# Patient Record
Sex: Female | Born: 1945 | Race: White | Hispanic: No | Smoking: Never smoker
Health system: Southern US, Community
[De-identification: ages and names within clinical notes are randomized; demographics above are authoritative.]

## PROBLEM LIST (undated history)

## (undated) DIAGNOSIS — F419 Anxiety disorder, unspecified: Secondary | ICD-10-CM

## (undated) DIAGNOSIS — M1990 Unspecified osteoarthritis, unspecified site: Secondary | ICD-10-CM

## (undated) DIAGNOSIS — F32A Depression, unspecified: Secondary | ICD-10-CM

## (undated) DIAGNOSIS — Z972 Presence of dental prosthetic device (complete) (partial): Secondary | ICD-10-CM

## (undated) DIAGNOSIS — W5501XA Bitten by cat, initial encounter: Secondary | ICD-10-CM

## (undated) DIAGNOSIS — I10 Essential (primary) hypertension: Secondary | ICD-10-CM

## (undated) DIAGNOSIS — J302 Other seasonal allergic rhinitis: Secondary | ICD-10-CM

## (undated) DIAGNOSIS — F329 Major depressive disorder, single episode, unspecified: Secondary | ICD-10-CM

## (undated) DIAGNOSIS — E7800 Pure hypercholesterolemia, unspecified: Secondary | ICD-10-CM

## (undated) HISTORY — PX: JOINT REPLACEMENT: SHX530

---

## 2012-06-27 ENCOUNTER — Ambulatory Visit: Payer: Self-pay | Admitting: Unknown Physician Specialty

## 2012-06-29 LAB — PATHOLOGY REPORT

## 2013-06-06 ENCOUNTER — Ambulatory Visit: Payer: Self-pay | Admitting: Family Medicine

## 2014-09-18 ENCOUNTER — Other Ambulatory Visit: Payer: Self-pay | Admitting: Family Medicine

## 2014-09-18 DIAGNOSIS — R1084 Generalized abdominal pain: Secondary | ICD-10-CM

## 2014-09-18 DIAGNOSIS — R319 Hematuria, unspecified: Secondary | ICD-10-CM

## 2014-09-23 ENCOUNTER — Ambulatory Visit
Admission: RE | Admit: 2014-09-23 | Discharge: 2014-09-23 | Disposition: A | Discharge status: Home / Self Care | Payer: Medicare Other | Source: Ambulatory Visit | Attending: Family Medicine | Admitting: Family Medicine

## 2014-09-23 DIAGNOSIS — R319 Hematuria, unspecified: Secondary | ICD-10-CM | POA: Insufficient documentation

## 2014-09-23 DIAGNOSIS — N281 Cyst of kidney, acquired: Secondary | ICD-10-CM | POA: Insufficient documentation

## 2014-09-23 DIAGNOSIS — K5730 Diverticulosis of large intestine without perforation or abscess without bleeding: Secondary | ICD-10-CM | POA: Diagnosis not present

## 2014-09-23 DIAGNOSIS — K760 Fatty (change of) liver, not elsewhere classified: Secondary | ICD-10-CM | POA: Insufficient documentation

## 2014-09-23 DIAGNOSIS — R1084 Generalized abdominal pain: Secondary | ICD-10-CM

## 2014-09-23 HISTORY — DX: Essential (primary) hypertension: I10

## 2014-09-23 MED ORDER — IOHEXOL 300 MG/ML  SOLN
100.0000 mL | Freq: Once | INTRAMUSCULAR | Status: AC | PRN
  Administered 2014-09-23: 100 mL via INTRAVENOUS

## 2015-02-18 ENCOUNTER — Encounter: Payer: Self-pay | Admitting: *Deleted

## 2015-02-20 NOTE — Discharge Instructions (Signed)

## 2015-02-25 ENCOUNTER — Encounter
Admission: RE | Disposition: A | Discharge status: Home / Self Care | Payer: Self-pay | Source: Ambulatory Visit | Attending: Ophthalmology

## 2015-02-25 ENCOUNTER — Ambulatory Visit
Admission: RE | Admit: 2015-02-25 | Discharge: 2015-02-25 | Disposition: A | Discharge status: Home / Self Care | Payer: Medicare Other | Source: Ambulatory Visit | Attending: Ophthalmology | Admitting: Ophthalmology

## 2015-02-25 ENCOUNTER — Ambulatory Visit: Payer: Medicare Other | Admitting: Anesthesiology

## 2015-02-25 DIAGNOSIS — F419 Anxiety disorder, unspecified: Secondary | ICD-10-CM | POA: Insufficient documentation

## 2015-02-25 DIAGNOSIS — M1991 Primary osteoarthritis, unspecified site: Secondary | ICD-10-CM | POA: Diagnosis not present

## 2015-02-25 DIAGNOSIS — Z9109 Other allergy status, other than to drugs and biological substances: Secondary | ICD-10-CM | POA: Insufficient documentation

## 2015-02-25 DIAGNOSIS — Z9889 Other specified postprocedural states: Secondary | ICD-10-CM | POA: Insufficient documentation

## 2015-02-25 DIAGNOSIS — E7800 Pure hypercholesterolemia, unspecified: Secondary | ICD-10-CM | POA: Diagnosis not present

## 2015-02-25 DIAGNOSIS — H269 Unspecified cataract: Secondary | ICD-10-CM | POA: Diagnosis present

## 2015-02-25 DIAGNOSIS — Z79899 Other long term (current) drug therapy: Secondary | ICD-10-CM | POA: Insufficient documentation

## 2015-02-25 DIAGNOSIS — Z888 Allergy status to other drugs, medicaments and biological substances status: Secondary | ICD-10-CM | POA: Diagnosis not present

## 2015-02-25 DIAGNOSIS — F329 Major depressive disorder, single episode, unspecified: Secondary | ICD-10-CM | POA: Diagnosis not present

## 2015-02-25 DIAGNOSIS — H2511 Age-related nuclear cataract, right eye: Secondary | ICD-10-CM | POA: Insufficient documentation

## 2015-02-25 DIAGNOSIS — Z7951 Long term (current) use of inhaled steroids: Secondary | ICD-10-CM | POA: Insufficient documentation

## 2015-02-25 HISTORY — DX: Bitten by cat, initial encounter: W55.01XA

## 2015-02-25 HISTORY — DX: Other seasonal allergic rhinitis: J30.2

## 2015-02-25 HISTORY — DX: Presence of dental prosthetic device (complete) (partial): Z97.2

## 2015-02-25 HISTORY — PX: CATARACT EXTRACTION W/PHACO: SHX586

## 2015-02-25 HISTORY — DX: Depression, unspecified: F32.A

## 2015-02-25 HISTORY — DX: Unspecified osteoarthritis, unspecified site: M19.90

## 2015-02-25 HISTORY — DX: Major depressive disorder, single episode, unspecified: F32.9

## 2015-02-25 HISTORY — DX: Pure hypercholesterolemia, unspecified: E78.00

## 2015-02-25 HISTORY — DX: Anxiety disorder, unspecified: F41.9

## 2015-02-25 SURGERY — PHACOEMULSIFICATION, CATARACT, WITH IOL INSERTION
Anesthesia: Monitor Anesthesia Care | Site: Eye | Laterality: Right | Wound class: Clean

## 2015-02-25 MED ORDER — POVIDONE-IODINE 5 % OP SOLN
1.0000 "application " | OPHTHALMIC | Status: DC | PRN
  Administered 2015-02-25: 1 via OPHTHALMIC

## 2015-02-25 MED ORDER — FENTANYL CITRATE (PF) 100 MCG/2ML IJ SOLN
INTRAMUSCULAR | Status: DC | PRN
  Administered 2015-02-25 (×2): 50 ug via INTRAVENOUS

## 2015-02-25 MED ORDER — NA HYALUR & NA CHOND-NA HYALUR 0.4-0.35 ML IO KIT
PACK | INTRAOCULAR | Status: DC | PRN
  Administered 2015-02-25: 1 mL via INTRAOCULAR

## 2015-02-25 MED ORDER — TIMOLOL MALEATE 0.5 % OP SOLN
OPHTHALMIC | Status: DC | PRN
  Administered 2015-02-25: 1 [drp] via OPHTHALMIC

## 2015-02-25 MED ORDER — CEFUROXIME OPHTHALMIC INJECTION 1 MG/0.1 ML
INJECTION | OPHTHALMIC | Status: DC | PRN
  Administered 2015-02-25: 0.1 mL via INTRACAMERAL

## 2015-02-25 MED ORDER — BRIMONIDINE TARTRATE 0.2 % OP SOLN
OPHTHALMIC | Status: DC | PRN
Start: 2015-02-25 — End: 2015-02-25
  Administered 2015-02-25: 1 [drp] via OPHTHALMIC

## 2015-02-25 MED ORDER — TETRACAINE HCL 0.5 % OP SOLN
1.0000 [drp] | OPHTHALMIC | Status: DC | PRN
  Administered 2015-02-25: 1 [drp] via OPHTHALMIC

## 2015-02-25 MED ORDER — LACTATED RINGERS IV SOLN: INTRAVENOUS | Status: DC

## 2015-02-25 MED ORDER — ARMC OPHTHALMIC DILATING GEL
1.0000 "application " | OPHTHALMIC | Status: DC | PRN
  Administered 2015-02-25 (×2): 1 via OPHTHALMIC

## 2015-02-25 MED ORDER — EPINEPHRINE HCL 1 MG/ML IJ SOLN
INTRAOCULAR | Status: DC | PRN
  Administered 2015-02-25: 65 mL via OPHTHALMIC
  Administered 2015-02-25: 12:00:00 via OPHTHALMIC

## 2015-02-25 MED ORDER — MIDAZOLAM HCL 2 MG/2ML IJ SOLN
INTRAMUSCULAR | Status: DC | PRN
  Administered 2015-02-25 (×2): 1 mg via INTRAVENOUS

## 2015-02-25 SURGICAL SUPPLY — 27 items
CANNULA ANT/CHMB 27GA (MISCELLANEOUS) ×3 IMPLANT
CARTRIDGE ABBOTT (MISCELLANEOUS) ×3 IMPLANT
GLOVE SURG LX 7.5 STRW (GLOVE) ×2
GLOVE SURG LX STRL 7.5 STRW (GLOVE) ×1 IMPLANT
GLOVE SURG TRIUMPH 8.0 PF LTX (GLOVE) ×3 IMPLANT
GOWN STRL REUS W/ TWL LRG LVL3 (GOWN DISPOSABLE) ×2 IMPLANT
GOWN STRL REUS W/TWL LRG LVL3 (GOWN DISPOSABLE) ×4
LENS IOL TECNIS 19.5 (Intraocular Lens) ×3 IMPLANT
LENS IOL TECNIS MONO 1P 19.5 (Intraocular Lens) ×1 IMPLANT
MARKER SKIN DUAL TIP RULER LAB (MISCELLANEOUS) ×3 IMPLANT
NDL RETROBULBAR .5 NSTRL (NEEDLE) IMPLANT
NEEDLE FILTER BLUNT 18X 1/2SAF (NEEDLE) ×2
NEEDLE FILTER BLUNT 18X1 1/2 (NEEDLE) ×1 IMPLANT
PACK CATARACT BRASINGTON (MISCELLANEOUS) ×3 IMPLANT
PACK EYE AFTER SURG (MISCELLANEOUS) ×3 IMPLANT
PACK OPTHALMIC (MISCELLANEOUS) ×3 IMPLANT
RING MALYGIN 7.0 (MISCELLANEOUS) IMPLANT
SUT ETHILON 10-0 CS-B-6CS-B-6 (SUTURE)
SUT VICRYL  9 0 (SUTURE)
SUT VICRYL 9 0 (SUTURE) IMPLANT
SUTURE EHLN 10-0 CS-B-6CS-B-6 (SUTURE) IMPLANT
SYR 3ML LL SCALE MARK (SYRINGE) ×3 IMPLANT
SYR 5ML LL (SYRINGE) IMPLANT
SYR TB 1ML LUER SLIP (SYRINGE) ×3 IMPLANT
WATER STERILE IRR 250ML POUR (IV SOLUTION) ×3 IMPLANT
WATER STERILE IRR 500ML POUR (IV SOLUTION) IMPLANT
WIPE NON LINTING 3.25X3.25 (MISCELLANEOUS) ×3 IMPLANT

## 2015-02-25 NOTE — Op Note (Signed)
LOCATION:  [facility]   PREOPERATIVE DIAGNOSIS:    Nuclear sclerotic cataract right eye. H25.11   POSTOPERATIVE DIAGNOSIS:  Nuclear sclerotic cataract right eye.     PROCEDURE:  Phacoemusification with posterior chamber intraocular lens placement of the right eye   LENS:   Implant Name Type Inv. Item Serial No. Manufacturer Lot No. LRB No. Used  LENS IOL TECNIS 19.5 - [id number] Intraocular Lens LENS IOL TECNIS 19.5 [id number] AMO   Right 1        ULTRASOUND TIME: 9 % of 0 minutes, 41 seconds.  CDE 3.6   SURGEON:  Deirdre Evener, MD   ANESTHESIA:  Topical with tetracaine drops and 2% Xylocaine jelly.   COMPLICATIONS:  None.   DESCRIPTION OF PROCEDURE:  The patient was identified in the holding room and transported to the operating room and placed in the supine position under the operating microscope.  The right eye was identified as the operative eye and it was prepped and draped in the usual sterile ophthalmic fashion.   A 1 millimeter clear-corneal paracentesis was made at the 12:00 position.  The anterior chamber was filled with Viscoat viscoelastic.  A 2.4 millimeter keratome was used to make a near-clear corneal incision at the 9:00 position.  A curvilinear capsulorrhexis was made with a cystotome and capsulorrhexis forceps.  Balanced salt solution was used to hydrodissect and hydrodelineate the nucleus.   Phacoemulsification was then used in stop and chop fashion to remove the lens nucleus and epinucleus.  The remaining cortex was then removed using the irrigation and aspiration handpiece. Provisc was then placed into the capsular bag to distend it for lens placement.  A lens was then injected into the capsular bag.  The remaining viscoelastic was aspirated.   Wounds were hydrated with balanced salt solution.  The anterior chamber was inflated to a physiologic pressure with balanced salt solution.  No wound leaks were noted. Cefuroxime 0.1 ml of a /ml solution  was injected into the anterior chamber for a dose of 1 mg of intracameral antibiotic at the completion of the case.   Timolol and Brimonidine drops were applied to the eye.  The patient was taken to the recovery room in stable condition without complications of anesthesia or surgery.   Maddoxx Burkitt 02/25/2015, 12:18 PM

## 2015-02-25 NOTE — H&P (Signed)
  The History and Physical notes are on paper, have been signed, and are to be scanned. The patient remains stable and unchanged from the H&P.   Previous H&P reviewed, patient examined, and there are no changes.  Haley Porter 02/25/2015 10:57 AM  

## 2015-02-25 NOTE — Transfer of Care (Signed)
Immediate Anesthesia Transfer of Care Note  Patient: Haley Porter  Procedure(s) Performed: Procedure(s): CATARACT EXTRACTION PHACO AND INTRAOCULAR LENS PLACEMENT (IOC) (Right)  Patient Location: PACU  Anesthesia Type: MAC  Level of Consciousness: awake, alert  and patient cooperative  Airway and Oxygen Therapy: Patient Spontanous Breathing and Patient connected to supplemental oxygen  Post-op Assessment: Post-op Vital signs reviewed, Patient's Cardiovascular Status Stable, Respiratory Function Stable, Patent Airway and No signs of Nausea or vomiting  Post-op Vital Signs: Reviewed and stable  Complications: No apparent anesthesia complications

## 2015-02-25 NOTE — Anesthesia Preprocedure Evaluation (Signed)
Anesthesia Evaluation  Patient identified by MRN, date of birth, ID band  Airway Mallampati: II  TM Distance: >3 FB     Dental   Pulmonary           Cardiovascular hypertension,      Neuro/Psych PSYCHIATRIC DISORDERS Anxiety Depression    GI/Hepatic   Endo/Other    Renal/GU      Musculoskeletal  (+) Arthritis , Osteoarthritis,    Abdominal   Peds  Hematology   Anesthesia Other Findings   Reproductive/Obstetrics                             Anesthesia Physical Anesthesia Plan  ASA: II  Anesthesia Plan: MAC   Post-op Pain Management:    Induction:   Airway Management Planned:   Additional Equipment:   Intra-op Plan:   Post-operative Plan:   Informed Consent: I have reviewed the patients History and Physical, chart, labs and discussed the procedure including the risks, benefits and alternatives for the proposed anesthesia with the patient or authorized representative who has indicated his/her understanding and acceptance.     Plan Discussed with: CRNA  Anesthesia Plan Comments:         Anesthesia Quick Evaluation

## 2015-02-25 NOTE — Anesthesia Procedure Notes (Signed)
Procedure Name: MAC Performed by: Shayden Bobier Pre-anesthesia Checklist: Patient identified, Emergency Drugs available, Suction available, Patient being monitored and Timeout performed Patient Re-evaluated:Patient Re-evaluated prior to inductionOxygen Delivery Method: Nasal cannula     

## 2015-02-25 NOTE — Anesthesia Postprocedure Evaluation (Signed)
Anesthesia Post Note  Patient: Haley Porter  Procedure(s) Performed: Procedure(s) (LRB): CATARACT EXTRACTION PHACO AND INTRAOCULAR LENS PLACEMENT (IOC) (Right)  Patient location during evaluation: PACU Anesthesia Type: General Level of consciousness: awake and alert Pain management: pain level controlled Vital Signs Assessment: post-procedure vital signs reviewed and stable Respiratory status: spontaneous breathing, nonlabored ventilation, respiratory function stable and patient connected to nasal cannula oxygen Cardiovascular status: blood pressure returned to baseline and stable Postop Assessment: no signs of nausea or vomiting Anesthetic complications: no    Dorene Grebe

## 2015-02-26 ENCOUNTER — Encounter: Payer: Self-pay | Admitting: Ophthalmology

## 2015-05-20 ENCOUNTER — Ambulatory Visit
Admission: EM | Admit: 2015-05-20 | Discharge: 2015-05-20 | Disposition: A | Discharge status: Home / Self Care | Payer: Medicare Other | Attending: Family Medicine | Admitting: Family Medicine

## 2015-05-20 ENCOUNTER — Encounter: Payer: Self-pay | Admitting: *Deleted

## 2015-05-20 DIAGNOSIS — Z889 Allergy status to unspecified drugs, medicaments and biological substances status: Secondary | ICD-10-CM

## 2015-05-20 DIAGNOSIS — T7840XA Allergy, unspecified, initial encounter: Secondary | ICD-10-CM

## 2015-05-20 DIAGNOSIS — R21 Rash and other nonspecific skin eruption: Secondary | ICD-10-CM | POA: Diagnosis not present

## 2015-05-20 MED ORDER — AMOXICILLIN 500 MG PO CAPS: 500.0000 mg | ORAL_CAPSULE | Freq: Three times a day (TID) | ORAL | Status: AC

## 2015-05-20 MED ORDER — DIPHENHYDRAMINE HCL 25 MG PO CAPS
25.0000 mg | ORAL_CAPSULE | Freq: Once | ORAL | Status: AC
  Administered 2015-05-20: 25 mg via ORAL

## 2015-05-20 MED ORDER — METHYLPREDNISOLONE SODIUM SUCC 125 MG IJ SOLR
125.0000 mg | Freq: Once | INTRAMUSCULAR | Status: AC
  Administered 2015-05-20: 125 mg via INTRAMUSCULAR

## 2015-05-20 NOTE — ED Provider Notes (Signed)
CSN: [id number]     Arrival date & time 05/20/15  1149 History   First MD Initiated Contact with Patient 05/20/15 1205     Chief Complaint  Patient presents with  . Allergic Reaction   (Consider location/radiation/quality/duration/timing/severity/associated sxs/prior Treatment) HPI: Patient presents today with symptoms of rash on face and burning feeling on face. Patient is also feeling slight shortness of breath but patient is unsure whether this is because of anxiety from her reaction. Patient was given doxycycline yesterday for tick bites. She states that she was also tested [facility] L[facility]'s [facility] spotted [facility]. She is awaiting results. She took her doxycycline today at around 6:30 AM. She took 2 doses of the doxycycline yesterday. She denies any chest pain, swelling of tongue or lips or ears. She denies taking any other new medications. She also denies spending time out in the sun today. She does admit to spending some time yesterday in the son but does wear a hat typically when she is out side. She denies any other rash anywhere else. She did not take any antihistamines prior to coming to the urgent care today. Oral prednisone has affected her mood in the past.  Past Medical History  Diagnosis Date  . Hypertension   . Hypercholesteremia   . Depression   . Arthritis     knees, right shoulder, right hip  . Wears dentures     partial lower  . Cat bite of multiple sites     hands, arms and legs.  on antibiotic  . Seasonal allergies   . Anxiety    Past Surgical History  Procedure Laterality Date  . Joint replacement Left     wrist  . Cataract extraction w/phaco Right 02/25/2015    Procedure: CATARACT EXTRACTION PHACO AND INTRAOCULAR LENS PLACEMENT (IOC);  Surgeon: Lockie Mola, MD;  [facility]: [facility];  Service: Ophthalmology;  Laterality: Right;   History reviewed. No pertinent family history. Social History  Substance Use Topics  . Smoking status: Never  Smoker   . Smokeless tobacco: None  . Alcohol Use: No   OB History    No data available     Review of Systems: Negative except mentioned above.   Allergies  Ace inhibitors and Prednisone  Home Medications   Prior to Admission medications   Medication Sig Start Date End Date Taking? Authorizing Provider  aspirin 325 MG tablet Take 325 mg by mouth daily.   Yes Historical Provider, MD  atorvastatin (LIPITOR) 40 MG tablet Take 40 mg by mouth daily.   Yes Historical Provider, MD  fluticasone (FLONASE) 50 MCG/ACT nasal spray Place into both nostrils daily as needed for allergies or rhinitis.   Yes Historical Provider, MD  hydrochlorothiazide (HYDRODIURIL) 25 MG tablet Take 25 mg by mouth daily. 0.5 to 1 tab daily   Yes Historical Provider, MD  lamoTRIgine (LAMICTAL) 25 MG tablet Take 25 mg by mouth 2 (two) times daily. 2.5 tabs twice daily   Yes Historical Provider, MD  LORazepam (ATIVAN) 0.5 MG tablet Take 0.5 mg by mouth as needed for anxiety.   Yes Historical Provider, MD  losartan (COZAAR) 100 MG tablet Take 100 mg by mouth daily.   Yes Historical Provider, MD  POTASSIUM PO Take 10 mcg by mouth 2 (two) times daily.   Yes Historical Provider, MD  vitamin C (ASCORBIC ACID) 500 MG tablet Take 500 mg by mouth daily.   Yes Historical Provider, MD  amoxicillin-clavulanate (AUGMENTIN) 875-125 MG tablet Take 1  tablet by mouth 2 (two) times daily.    Historical Provider, MD   Meds Ordered and Administered this Visit   Medications  methylPREDNISolone sodium succinate (SOLU-MEDROL) 125 mg/2 mL injection 125 mg (125 mg Intramuscular Given 05/20/15 1212)  diphenhydrAMINE (BENADRYL) capsule 25 mg (25 mg Oral Given 05/20/15 1212)    BP 177/72 mmHg  Pulse 128  Temp(Src) 99 F (37.2 C) (Oral)  Resp 20  Ht 5\' 6"  (1.676 m)  Wt 190 lb (86.183 kg)  BMI 30.68 kg/m2  SpO2 96% No data found.   Physical Exam  GENERAL: NAD HEENT: no pharyngeal erythema, no exudate, no erythema of TMs, no facial  swelling appreciated, no cervical LAD RESP: CTA B, no wheezing, no accessory muscle use CARD: tachycardia SKIN: mild erythema of face, no hives appreciated  NEURO: CN II-XII grossly intact   ED Course  Procedures (including critical care time)  Labs Review Labs Reviewed - No data to display  Imaging Review No results found.    MDM  A/P: Allergic reaction to Doxycycline vs. Photosensitivity- patient given Benadryl 25 mg by mouth in the office and also SoluMedrol 125 mg IM. Patient felt much better after the medication. She now admits to having no breathing difficulty and states that her face is only minimally burning now. I have changed her antibiotic to amoxicillin 3 times a day. I've advised that she stop taking the doxycycline. She should call her primary care physician who prescribed the doxycycline and let them know that the medication has been switched to amoxicillin. Patient declines to take any oral prednisone given her symptoms she has had in the past related to the medication. Can take oral antihistamine when necessary. If any symptoms return patient is to seek immediate medical attention as discussed.   Jolene ProvostKirtida Jaycen Vercher, MD 05/20/15 1251

## 2015-05-20 NOTE — ED Notes (Signed)
Pt seen by PCP yesterday and Rx of Doxycycline for tick bites, took 2nd dose Doxycycline and shortly thereafter began having intense burning sensation about her face and mild dyspnea. Pt called PCP but they were unable to see her today.

## 2017-12-21 ENCOUNTER — Other Ambulatory Visit: Payer: Self-pay | Admitting: Family Medicine

## 2017-12-21 DIAGNOSIS — S22081A Stable burst fracture of T11-T12 vertebra, initial encounter for closed fracture: Secondary | ICD-10-CM

## 2018-01-17 ENCOUNTER — Ambulatory Visit
Admission: RE | Admit: 2018-01-17 | Discharge: 2018-01-17 | Disposition: A | Discharge status: Home / Self Care | Payer: Medicare Other | Source: Ambulatory Visit | Attending: Family Medicine | Admitting: Family Medicine

## 2018-01-17 DIAGNOSIS — S22081A Stable burst fracture of T11-T12 vertebra, initial encounter for closed fracture: Secondary | ICD-10-CM

## 2018-01-23 ENCOUNTER — Ambulatory Visit: Payer: Medicare Other | Attending: Family Medicine | Admitting: Physical Therapy

## 2018-01-23 ENCOUNTER — Encounter: Payer: Self-pay | Admitting: Physical Therapy

## 2018-01-23 DIAGNOSIS — Z9889 Other specified postprocedural states: Secondary | ICD-10-CM

## 2018-01-23 DIAGNOSIS — M6281 Muscle weakness (generalized): Secondary | ICD-10-CM

## 2018-01-23 DIAGNOSIS — R293 Abnormal posture: Secondary | ICD-10-CM | POA: Diagnosis present

## 2018-01-23 NOTE — Patient Instructions (Signed)
Access Code: [email or web address]  URL: [email or web address]  Date: 01/23/2018  Prepared by: Dorene Grebe   Exercises  Scapular Retraction with Resistance - 15 reps - 3 sets - 1x daily - 7x weekly  Shoulder Extension with Resistance - 15 reps - 3 sets - 1x daily - 7x weekly

## 2018-01-23 NOTE — Therapy (Signed)
[facility] [address] [place], [place], [address] Phone: [phone]   Fax:  [phone]  Physical Therapy Evaluation  Patient Details  Name: Haley Porter MRN: [id number] Date of Birth: 10/01/1945 Referring Provider (PT): Dr. Sampson GoonKenneth Price   Encounter Date: 01/23/2018  PT End of Session - 01/23/18 1406    Visit Number  1    Number of Visits  9    Date for PT Re-Evaluation  02/20/18    Authorization - Visit Number  1    Authorization - Number of Visits  10    PT Start Time  1401    PT Stop Time  1512    PT Time Calculation (min)  71 min    Activity Tolerance  Patient tolerated treatment well    Behavior During Therapy  [facility] for tasks assessed/performed       Past Medical History:  Diagnosis Date  . Anxiety   . Arthritis    knees, right shoulder, right hip  . Cat bite of multiple sites    hands, arms and legs.  on antibiotic  . Depression   . Hypercholesteremia   . Hypertension   . Seasonal allergies   . Wears dentures    partial lower    Past Surgical History:  Procedure Laterality Date  . CATARACT EXTRACTION W/PHACO Right 02/25/2015   Procedure: CATARACT EXTRACTION PHACO AND INTRAOCULAR LENS PLACEMENT (IOC);  Surgeon: Lockie Molahadwick Brasington, MD;  [facility];  Service: Ophthalmology;  Laterality: Right;  . JOINT REPLACEMENT Left    wrist    There were no vitals filed for this visit.   Subjective Assessment - 01/23/18 1400    Subjective  Pt. s/p T12 kyphoplasty on 01/12/2018.  Pt. had a fall on 08/13/2017 in bathroom resulting in T12 fx.  Pt. was hoping the vertebrae would heal on its own but required kyphoplasty.      Pertinent History  pt. lives alone at home.  Pt. reports increase low back pain when increasing lordosis in seated posture.  Pt. benefits from raising B arms overhead to decrease low back pain.        Limitations  Standing;Lifting;Walking;House hold activities    Diagnostic tests   see imaging    Patient Stated Goals  Increase core muscle strength/ improve independence with walking/ pain mgmt.      Currently in Pain?  Yes    Pain Score  3     Pain Location  Back    Pain Orientation  Lower    Pain Descriptors / Indicators  Aching    Pain Type  Chronic pain         [facility] PT Assessment - 01/23/18 0001      Assessment   Medical Diagnosis  S/p kyphoplasty/ back pain.      Referring Provider (PT)  Dr. Sampson GoonKenneth Price    Onset Date/Surgical Date  01/12/18   PRN   Next MD Visit  --   PRN   Prior Therapy  PT at SNF      Precautions   Precautions  Fall      Restrictions   Weight Bearing Restrictions  No      Balance Screen   Has the patient fallen in the past 6 months  Yes    How many times?  1      Prior Function   Level of Independence  Independent      Cognition   Overall Cognitive  Status  Within Functional Limits for tasks assessed        Objective measurements completed on examination: See above findings.   Treatment:   Resisted scap retraction with RTB 2x15 Resisted standing shoulder extension RTB 2x15 Bilat resisted bicep curls RTB 2x15 Bilat resisted standing shoulder ER RTB 2x15 Standing resisted resisted cable punches 2x15 20 lbs.  Transverse abdominus education and progression (pg. 1)    PT Education - 01/23/18 1419    Education Details  See HEP (page #1 of core program).  Discussed heat vs. ice.      Person(s) Educated  Patient    Methods  Explanation;Demonstration;Handout    Comprehension  Verbalized understanding;Returned demonstration          PT Long Term Goals - 01/23/18 1610      PT LONG TERM GOAL #1   Title  Pt. Will score 57 on FOTO to improve pain free movement with daily activities      Baseline  Pt. scored 45 on FOTO (01/23/2018)    Time  4    Period  Weeks    Status  New    Target Date  02/20/18      PT LONG TERM GOAL #2   Title  Pt. Will be able to turn to her left and right during backing up in her car  with 0/10 on NPS.     Baseline  Pt. currently has 3/10 on NPS pain when turning to back up while driving her car.     Time  4    Period  Weeks    Status  New    Target Date  02/20/18      PT LONG TERM GOAL #3   Title  Pt. Will be able to confidently perform personalized HEP home maintenance program independently and with 0/10 on NPS.     Baseline  Pt. was given HEP starting (01/23/2018) with exercies that will be progressed to an independant maintenance program.     Time  4    Period  Weeks    Status  New    Target Date  02/20/18      PT LONG TERM GOAL #4   Title  Pt. Will be able to demonstrate B UE muscle strength to grossly 5/5 MMT to improve upright posture/ ADLs.      Baseline  Pt. currently has  4-/5 right and 4/5 left shoulder abduction. Shoulder flexion: 4/5 right, 4+/5 left as of 01/23/2018    Time  4    Period  Weeks    Status  New    Target Date  02/20/18         Plan - 01/23/18 1406    Clinical Impression Statement   Pt. is a pleasant 73 y/o female s/p T12 kyphoplasty on 01/12/2018.  Pt. had a fall on 08/12/2017 resulting in poor healing vertebrae fracture.  Pt. reports 3/10 low back pain at rest and >6/10 with increase activity.  Pt. presents with good B UE/LE AROM.  B UE strengthending: biceps 4+/5 MMT, triceps 4/5 MMT,sh. abd. R 4-/5, L 4/5, sh. flexion: R 4/5, L 4+/5 MMT.  B LE strength grossly 5/5 MMT except hip flexion 4+/5 MMT.  Good incision healing noted on back. No tenderness upon palpation to thoracic or lumbar spine. PT noted some muscle guarding in lumbar/thoracic extensors. Pt. Instructed to use heat and ice to control pain and muscle tightness as needed. Pt. Was given HEP to improve kyphotic posture, core stability,  and generalized UE strength. PT expects pt. to progress well without problem. Pt. Will benefit form skilled PT therapeutic exercise and neuromuscular reeducation of transverse abdominus to improve core and trunk stability to ease pain and decrease risk  of re-injury.     Clinical Presentation  Stable    Clinical Decision Making  Low    Rehab Potential  Fair    PT Frequency  2x / week    PT Duration  4 weeks    PT Treatment/Interventions  ADLs/Self Care Home Management;Cryotherapy;Electrical Stimulation;Moist Heat;Functional mobility Dentisttraining;Stair training;Gait training;Therapeutic activities;Therapeutic exercise;Balance training;Neuromuscular re-education;Manual techniques;Patient/family education;Passive range of motion    PT Next Visit Plan  Progress core strengthening.      PT Home Exercise Plan  see handouts       Patient will benefit from skilled therapeutic intervention in order to improve the following deficits and impairments:  Improper body mechanics, Pain, Decreased mobility, Postural dysfunction, Decreased strength, Decreased range of motion, Decreased endurance, Decreased activity tolerance, Decreased balance, Difficulty walking  Visit Diagnosis: S/P kyphoplasty  Muscle weakness (generalized)  Abnormal posture     Problem List There are no active problems to display for this patient.  Cammie McgeeMichael C Sherk, PT, DPT # [id number] Rosalyn ChartersJosh Marthe Dant, SPT 01/24/2018, 7:38 AM  [place] [facility] [address] [place], [place], [address] Phone: [phone]   Fax:  [phone]  Name: Haley Porter MRN: [id number] Date of Birth: February 20, 1945

## 2018-01-31 ENCOUNTER — Ambulatory Visit: Payer: Medicare Other

## 2018-01-31 DIAGNOSIS — R293 Abnormal posture: Secondary | ICD-10-CM

## 2018-01-31 DIAGNOSIS — M6281 Muscle weakness (generalized): Secondary | ICD-10-CM

## 2018-01-31 DIAGNOSIS — Z9889 Other specified postprocedural states: Secondary | ICD-10-CM

## 2018-01-31 NOTE — Therapy (Signed)
[facility] [address]. [place], [place], [address] Phone: [phone]   Fax:  [phone]  Physical Therapy Treatment  Patient Details  Name: DENI CICCIO MRN: [id number] Date of Birth: 20-Jul-1945 Referring Provider (PT): Dr. Sampson Goon   Encounter Date: 01/31/2018  PT End of Session - 02/01/18 0728    Visit Number  2    Number of Visits  9    Date for PT Re-Evaluation  02/20/18    Authorization - Visit Number  2    Authorization - Number of Visits  10    PT Start Time  1346    PT Stop Time  1432    PT Time Calculation (min)  46 min    Equipment Utilized During Treatment  Gait belt    Activity Tolerance  Patient tolerated treatment well    Behavior During Therapy  [facility] for tasks assessed/performed       Past Medical History:  Diagnosis Date  . Anxiety   . Arthritis    knees, right shoulder, right hip  . Cat bite of multiple sites    hands, arms and legs.  on antibiotic  . Depression   . Hypercholesteremia   . Hypertension   . Seasonal allergies   . Wears dentures    partial lower    Past Surgical History:  Procedure Laterality Date  . CATARACT EXTRACTION W/PHACO Right 02/25/2015   Procedure: CATARACT EXTRACTION PHACO AND INTRAOCULAR LENS PLACEMENT (IOC);  Surgeon: Lockie Mola, MD;  Location: [facility];  Service: Ophthalmology;  Laterality: Right;  . JOINT REPLACEMENT Left    wrist    There were no vitals filed for this visit.  Subjective Assessment - 02/01/18 0726    Subjective  Pt. entered clinic today with 2/10 on NPS. She reported keeping up with her HEP but has some questions on how to complete one of her exercises. She also reported a little bit of intermittent bilat low back pain along L5 region.     Pertinent History  pt. lives alone at home.  Pt. reports increase low back pain when increasing lordosis in seated posture.  Pt. benefits from raising B arms overhead to decrease low  back pain.        Limitations  Standing;Lifting;Walking;House hold activities    Diagnostic tests  see imaging    Patient Stated Goals  Increase core muscle strength/ improve independence with walking/ pain mgmt.      Currently in Pain?  Yes    Pain Score  1     Pain Location  Back    Pain Orientation  Lower    Pain Descriptors / Indicators  Aching    Pain Type  Chronic pain         Treatment:    Therapeutic exercises: Resisted scap retraction with RTB 2x15, tactile cues for form Resisted standing shoulder extension RTB 2x15, visual cues for posture Bilat resisted bicep curls GTB 2x15, cues for transverse abdominis RTB resisted punches with PT assist and mirror feedback Bilat resisted standing shoulder ER RTB 2x15 verbal and visual cues for form Standing scapular retraction and postural correction with wall for tactile feedback, x8 with 3-5sec holds, verbal/tactile cues for technique Supine transverse abdominis activation, verbal/tactile cues, mod verbal cues 10x3sec holds for exercise technique     PT Long Term Goals - 01/23/18 1610      PT LONG TERM GOAL #1   Title  Pt. Will score 57 on  FOTO to improve pain free movement with daily activities      Baseline  Pt. scored 45 on FOTO (01/23/2018)    Time  4    Period  Weeks    Status  New    Target Date  02/20/18      PT LONG TERM GOAL #2   Title  Pt. Will be able to turn to her left and right during backing up in her car with 0/10 on NPS.     Baseline  Pt. currently has 3/10 on NPS pain when turning to back up while driving her car.     Time  4    Period  Weeks    Status  New    Target Date  02/20/18      PT LONG TERM GOAL #3   Title  Pt. Will be able to confidently perform personalized HEP home maintenance program independently and with 0/10 on NPS.     Baseline  Pt. was given HEP starting (01/23/2018) with exercies that will be progressed to an independant maintenance program.     Time  4    Period  Weeks    Status   New    Target Date  02/20/18      PT LONG TERM GOAL #4   Title  Pt. Will be able to demonstrate B UE muscle strength to grossly 5/5 MMT to improve upright posture/ ADLs.      Baseline  Pt. currently has  4-/5 right and 4/5 left shoulder abduction. Shoulder flexion: 4/5 right, 4+/5 left as of 01/23/2018    Time  4    Period  Weeks    Status  New    Target Date  02/20/18            Plan - 02/01/18 0737    Clinical Impression Statement  Pt. entered clinic with 2/10 NPS pain at T12 location in her back. PT provided mod verbal and tactile cueing to help pt. achieve proper scapular movement during resisted scap retractions. Pt. remained highly motivated for therapy throughout the entirety of the session. During postural training against the wall, PT provided mod verbal cues for pt. to maintain constant contact of shoulders during 3-second holds. PT educated pt. on correct transverse abdominus exercises with min verbal and tactile cueing to help achieve proper core activation and decrease hip rotation. Pt. was able to eliminate lower back pain with transverse abdominus education with min PT cueing to decrease posterior hip rotation. Pt. will continue to benefit from skill PT intervention to decrease pain, increase spinal stabilization, and decrease fall risk.     Clinical Presentation  Stable    Clinical Decision Making  Low    Rehab Potential  Fair    PT Frequency  2x / week    PT Duration  4 weeks    PT Treatment/Interventions  ADLs/Self Care Home Management;Cryotherapy;Electrical Stimulation;Moist Heat;Functional mobility Dentist;Therapeutic activities;Therapeutic exercise;Balance training;Neuromuscular re-education;Manual techniques;Patient/family education;Passive range of motion    PT Next Visit Plan  Progress core strengthening.      PT Home Exercise Plan  see handouts       Patient will benefit from skilled therapeutic intervention in order to improve the  following deficits and impairments:  Improper body mechanics, Pain, Decreased mobility, Postural dysfunction, Decreased strength, Decreased range of motion, Decreased endurance, Decreased activity tolerance, Decreased balance, Difficulty walking  Visit Diagnosis: S/P kyphoplasty  Muscle weakness (generalized)  Abnormal posture     Problem  List There are no active problems to display for this patient.  This entire session was performed under direct supervision and direction of a licensed therapist/therapist assistant . I have personally read, edited and approve of the note as written.  Olga Coasteriana Campbell PT, DPT 11:19 AM,02/01/18 [phone]  [facility] [address] [place], [place], [address] Phone: [phone]   Fax:  [phone]  Name: Hilario QuarryJoyce L Peaden MRN: [id number] Date of Birth: 15-Aug-1945

## 2018-02-07 ENCOUNTER — Ambulatory Visit: Payer: Medicare Other | Attending: Family Medicine

## 2018-02-07 DIAGNOSIS — M6281 Muscle weakness (generalized): Secondary | ICD-10-CM

## 2018-02-07 DIAGNOSIS — R293 Abnormal posture: Secondary | ICD-10-CM

## 2018-02-07 DIAGNOSIS — Z9889 Other specified postprocedural states: Secondary | ICD-10-CM

## 2018-02-07 NOTE — Therapy (Deleted)
[facility] [address]. [place], [place], [address] Phone: [phone]   Fax:  [phone]  Physical Therapy Treatment  Patient Details  Name: Haley Porter MRN: [id number] Date of Birth: 1945-12-04 Referring Provider (PT): Dr. Sampson Goon   Encounter Date: 02/07/2018  PT End of Session - 02/07/18 1431    Visit Number  3    Number of Visits  9    Date for PT Re-Evaluation  02/20/18    Authorization - Visit Number  3    Authorization - Number of Visits  10    PT Start Time  1344    PT Stop Time  1430    PT Time Calculation (min)  46 min    Activity Tolerance  Patient tolerated treatment well    Behavior During Therapy  [facility] for tasks assessed/performed       Past Medical History:  Diagnosis Date  . Anxiety   . Arthritis    knees, right shoulder, right hip  . Cat bite of multiple sites    hands, arms and legs.  on antibiotic  . Depression   . Hypercholesteremia   . Hypertension   . Seasonal allergies   . Wears dentures    partial lower    Past Surgical History:  Procedure Laterality Date  . CATARACT EXTRACTION W/PHACO Right 02/25/2015   Procedure: CATARACT EXTRACTION PHACO AND INTRAOCULAR LENS PLACEMENT (IOC);  Surgeon: Lockie Mola, MD;  Location: [facility];  Service: Ophthalmology;  Laterality: Right;  . JOINT REPLACEMENT Left    wrist    There were no vitals filed for this visit.     Treatment:    Therapeutic exercises: Resisted scap retraction with RTB 2x15, tactile cues for form Bilat resisted standing shoulder ER RTB 2x15 verbal and visual cues for form Supine transverse abdominis activation, verbal/tactile cues, mod verbal cues 10x3sec holds for exercise technique LTR x10 in supine, verbal and tactile cues hooklying hip abduction with YTB x10 Supine dead bug x10 bilaterally, difficulty with technique, verbal visual and tactile cues throughout Bridges in supine, initial  complaints of low back pain, improved with less pillows under head, ball between knees to squeeze 8-10 reps                              PT Long Term Goals - 01/23/18 1610      PT LONG TERM GOAL #1   Title  Pt. Will score 57 on FOTO to improve pain free movement with daily activities      Baseline  Pt. scored 45 on FOTO (01/23/2018)    Time  4    Period  Weeks    Status  New    Target Date  02/20/18      PT LONG TERM GOAL #2   Title  Pt. Will be able to turn to her left and right during backing up in her car with 0/10 on NPS.     Baseline  Pt. currently has 3/10 on NPS pain when turning to back up while driving her car.     Time  4    Period  Weeks    Status  New    Target Date  02/20/18      PT LONG TERM GOAL #3   Title  Pt. Will be able to confidently perform personalized HEP home maintenance program independently and with 0/10 on NPS.  Baseline  Pt. was given HEP starting (01/23/2018) with exercies that will be progressed to an independant maintenance program.     Time  4    Period  Weeks    Status  New    Target Date  02/20/18      PT LONG TERM GOAL #4   Title  Pt. Will be able to demonstrate B UE muscle strength to grossly 5/5 MMT to improve upright posture/ ADLs.      Baseline  Pt. currently has  4-/5 right and 4/5 left shoulder abduction. Shoulder flexion: 4/5 right, 4+/5 left as of 01/23/2018    Time  4    Period  Weeks    Status  New    Target Date  02/20/18              Patient will benefit from skilled therapeutic intervention in order to improve the following deficits and impairments:     Visit Diagnosis: S/P kyphoplasty  Muscle weakness (generalized)  Abnormal posture     Problem List There are no active problems to display for this patient.   Olga Coaster 02/07/2018, 2:31 PM  [facility] [address] [place], [place], [address] Phone: [phone]   Fax:   [phone]  Name: Haley Porter MRN: [id number] Date of Birth: May 21, 1945

## 2018-02-07 NOTE — Therapy (Signed)
[facility] [address] [place], [place], [address] Phone: [phone]   Fax:  [phone]  Physical Therapy Treatment  Patient Details  Name: Haley QuarryJoyce L Ruhe MRN: [id number] Date of Birth: [birth year] Referring Provider (PT): Dr. Sampson GoonKenneth Price   Encounter Date: 02/07/2018  PT End of Session - 02/07/18 1431    Visit Number  3    Number of Visits  9    Date for PT Re-Evaluation  02/20/18    Authorization - Visit Number  3    Authorization - Number of Visits  10    PT Start Time  1344    PT Stop Time  1430    PT Time Calculation (min)  46 min    Activity Tolerance  Patient tolerated treatment well    Behavior During Therapy  [facility] for tasks assessed/performed       Past Medical History:  Diagnosis Date  . Anxiety   . Arthritis    knees, right shoulder, right hip  . Cat bite of multiple sites    hands, arms and legs.  on antibiotic  . Depression   . Hypercholesteremia   . Hypertension   . Seasonal allergies   . Wears dentures    partial lower    Past Surgical History:  Procedure Laterality Date  . CATARACT EXTRACTION W/PHACO Right 02/25/2015   Procedure: CATARACT EXTRACTION PHACO AND INTRAOCULAR LENS PLACEMENT (IOC);  Surgeon: Lockie Molahadwick Brasington, MD;  [facility];  Service: Ophthalmology;  Laterality: Right;  . JOINT REPLACEMENT Left    wrist    There were no vitals filed for this visit.  Subjective Assessment - 02/07/18 1532    Subjective  Pt. Entered clinic motivated to complete therapy. She reported feeling a little bit of pain 2/10 on NPS. Pt. Reported that her muscles felt tight along her back. Pt. Reported following her HEP without issues.      Pertinent History  pt. lives alone at home.  Pt. reports increase low back pain when increasing lordosis in seated posture.  Pt. benefits from raising B arms overhead to decrease low back pain.        Limitations  Standing;Lifting;Walking;House hold  activities    Diagnostic tests  see imaging    Patient Stated Goals  Increase core muscle strength/ improve independence with walking/ pain mgmt.      Currently in Pain?  Yes    Pain Score  2     Pain Location  Back    Pain Orientation  Lower    Pain Descriptors / Indicators  Aching    Pain Type  Chronic pain        Treatment:    Therapeutic exercises: Resisted scap retraction with GTB 2x15, tactile cues for form Bilat resisted standing shoulder ER RTB 2x15 verbal and visual cues for form Supine transverse abdominis activation progression, verbal/tactile cues, mod verbal cues 10x3sec holds for exercise technique LTR x10 in supine, verbal and tactile cues bilat side lying thoracic trunk rotation with arms behind head (verbal cues to isolate thoracic region) hooklying hip abduction with YTB x10 Supine dead bug x10 bilaterally, difficulty with technique, verbal visual and tactile cues throughout Bridges in supine, initial complaints of low back pain, improved with less pillows under head, ball between knees to squeeze 8-10 reps                PT Long Term Goals - 01/23/18 1610      PT  LONG TERM GOAL #1   Title  Pt. Will score 57 on FOTO to improve pain free movement with daily activities      Baseline  Pt. scored 45 on FOTO (01/23/2018)    Time  4    Period  Weeks    Status  New    Target Date  02/20/18      PT LONG TERM GOAL #2   Title  Pt. Will be able to turn to her left and right during backing up in her car with 0/10 on NPS.     Baseline  Pt. currently has 3/10 on NPS pain when turning to back up while driving her car.     Time  4    Period  Weeks    Status  New    Target Date  02/20/18      PT LONG TERM GOAL #3   Title  Pt. Will be able to confidently perform personalized HEP home maintenance program independently and with 0/10 on NPS.     Baseline  Pt. was given HEP starting (01/23/2018) with exercies that will be progressed to an independant maintenance  program.     Time  4    Period  Weeks    Status  New    Target Date  02/20/18      PT LONG TERM GOAL #4   Title  Pt. Will be able to demonstrate B UE muscle strength to grossly 5/5 MMT to improve upright posture/ ADLs.      Baseline  Pt. currently has  4-/5 right and 4/5 left shoulder abduction. Shoulder flexion: 4/5 right, 4+/5 left as of 01/23/2018    Time  4    Period  Weeks    Status  New    Target Date  02/20/18            Plan - 02/07/18 1541    Clinical Impression Statement  Pt. Entered clinic with new complaints of tightness in her low back. PT had pt. Perform lumbar and thoracic trunk rotation to increase movement of spine. Pt. Was instructed to use heating pad to assist with paraspinal tightness. Pt. Demonstrated continued core and hip weakness during therex. Pt. Responded well with min verbal and tactile feedback during trunk rotation which lead to decrease in tightness and discomfort. PT also provided tactile feedback during core stabilization therex to maintain upright posture. Pt. Will continue to benefit form skilled PT intervention to decrease pain and improve strength to perform functional ADL's     Clinical Presentation  Stable    Clinical Decision Making  Low    Rehab Potential  Fair    PT Frequency  2x / week    PT Duration  4 weeks    PT Treatment/Interventions  ADLs/Self Care Home Management;Cryotherapy;Electrical Stimulation;Moist Heat;Functional mobility Dentist;Therapeutic activities;Therapeutic exercise;Balance training;Neuromuscular re-education;Manual techniques;Patient/family education;Passive range of motion    PT Next Visit Plan  Progress core strengthening.      PT Home Exercise Plan  see handouts       Patient will benefit from skilled therapeutic intervention in order to improve the following deficits and impairments:  Improper body mechanics, Pain, Decreased mobility, Postural dysfunction, Decreased strength, Decreased  range of motion, Decreased endurance, Decreased activity tolerance, Decreased balance, Difficulty walking  Visit Diagnosis: S/P kyphoplasty  Muscle weakness (generalized)  Abnormal posture     Problem List There are no active problems to display for this patient.   Rosalyn Charters, SPT 02/07/2018,  3:55 PM  Olga Coasteriana Campbell PT, DPT 8:44 AM,02/08/18 [phone]  This entire session was performed under direct supervision and direction of a licensed therapist/therapist assistant . I have personally read, edited and approve of the note as written.  [facility] [address] [place], [place], [address] Phone: [phone]   Fax:  [phone]  Name: Haley QuarryJoyce L Porter MRN: [id number] Date of Birth: June 26, 1945

## 2018-02-12 ENCOUNTER — Ambulatory Visit: Payer: Medicare Other | Admitting: Physical Therapy

## 2018-02-14 ENCOUNTER — Ambulatory Visit: Payer: Medicare Other

## 2018-02-14 DIAGNOSIS — Z9889 Other specified postprocedural states: Secondary | ICD-10-CM | POA: Diagnosis not present

## 2018-02-14 DIAGNOSIS — M6281 Muscle weakness (generalized): Secondary | ICD-10-CM

## 2018-02-14 DIAGNOSIS — R293 Abnormal posture: Secondary | ICD-10-CM

## 2018-02-14 NOTE — Therapy (Signed)
[facility] [address] [place], [place], [address] Phone: [phone]   Fax:  [phone]  Physical Therapy Treatment  Patient Details  Name: Haley Porter MRN: [id number] Date of Birth: [birth year] Referring Provider (PT): Dr. Sampson Goon   Encounter Date: 02/14/2018  PT End of Session - 02/14/18 1400    Visit Number  4    Number of Visits  9    Date for PT Re-Evaluation  02/20/18    Authorization - Visit Number  4    Authorization - Number of Visits  10    PT Start Time  1352    PT Stop Time  1430    PT Time Calculation (min)  38 min    Equipment Utilized During Treatment  Gait belt    Activity Tolerance  Patient tolerated treatment well    Behavior During Therapy  [facility] for tasks assessed/performed       Past Medical History:  Diagnosis Date  . Anxiety   . Arthritis    knees, right shoulder, right hip  . Cat bite of multiple sites    hands, arms and legs.  on antibiotic  . Depression   . Hypercholesteremia   . Hypertension   . Seasonal allergies   . Wears dentures    partial lower    Past Surgical History:  Procedure Laterality Date  . CATARACT EXTRACTION W/PHACO Right 02/25/2015   Procedure: CATARACT EXTRACTION PHACO AND INTRAOCULAR LENS PLACEMENT (IOC);  Surgeon: Lockie Mola, MD;  Location: [facility];  Service: Ophthalmology;  Laterality: Right;  . JOINT REPLACEMENT Left    wrist    There were no vitals filed for this visit.  Subjective Assessment - 02/14/18 1355    Subjective  Patient reported that her pain was better for a couple days, but in the last day and today her pain has increased again in her mid back.    Pertinent History  pt. lives alone at home.  Pt. reports increase low back pain when increasing lordosis in seated posture.  Pt. benefits from raising B arms overhead to decrease low back pain.        Currently in Pain?  Yes    Pain Score  1     Pain Location  Back    Pain  Orientation  Mid    Pain Descriptors / Indicators  Aching       Treatment:  Therapeutic exercises: LTR x10 in supine with verbal cues for form transverse abdominis marching 2x10 cues for posterior pelvic tilt, tactile cues for technique Transverse activation with YTB x10 with cues for scapular retractions during shoulder flexion bilaterally with verbal/tactile cues for form Hip abduction sidelying x10 ea side intermittent tactile and verbal cues to maintain form Seated shoulder ER with YTB x 10 visual cues for posutre Standing shoulder ER with YTB x10 tactile cues for posture Standing ER with RTB x15 visual cues for posture Standing shoulder extensions bilaterally with transverse abdominis activation with BTB x15 with mirror for visual feedback Standing shoulder scaption with RTB 2x15 bilaterally cue tactile cues for scapular retraction with mirror for visual feedback, verbal cues for scapular retraction Scapular retractions with BTB x15-20 with mirror for visual feedback, tactile cues for scapular retractions 4oclock to 11oclock with weight ball with feet in normal stance x10 bilaterally with cues for core activation Lat pull downs 2x10 on 30#lbs with tactile and visual cues for form      PT Long Term  Goals - 01/23/18 1610      PT LONG TERM GOAL #1   Title  Pt. Will score 57 on FOTO to improve pain free movement with daily activities      Baseline  Pt. scored 45 on FOTO (01/23/2018)    Time  4    Period  Weeks    Status  New    Target Date  02/20/18      PT LONG TERM GOAL #2   Title  Pt. Will be able to turn to her left and right during backing up in her car with 0/10 on NPS.     Baseline  Pt. currently has 3/10 on NPS pain when turning to back up while driving her car.     Time  4    Period  Weeks    Status  New    Target Date  02/20/18      PT LONG TERM GOAL #3   Title  Pt. Will be able to confidently perform personalized HEP home maintenance program independently and  with 0/10 on NPS.     Baseline  Pt. was given HEP starting (01/23/2018) with exercies that will be progressed to an independant maintenance program.     Time  4    Period  Weeks    Status  New    Target Date  02/20/18      PT LONG TERM GOAL #4   Title  Pt. Will be able to demonstrate B UE muscle strength to grossly 5/5 MMT to improve upright posture/ ADLs.      Baseline  Pt. currently has  4-/5 right and 4/5 left shoulder abduction. Shoulder flexion: 4/5 right, 4+/5 left as of 01/23/2018    Time  4    Period  Weeks    Status  New    Target Date  02/20/18            Plan - 02/14/18 1832    Clinical Impression Statement  Patient reported midback pain this session that improved over time and with exercises this session. Pt very motivated to progress exercises and maximize strengthening program. Patient continued to need visual and tactile cues to maintain scapular retraction during exercises. Overall patient is progressing well and demonstrates excellent adherence to HEP and motivation. Next session patient goals will be assessed.    Rehab Potential  Fair    PT Frequency  2x / week    PT Duration  4 weeks    PT Treatment/Interventions  ADLs/Self Care Home Management;Cryotherapy;Electrical Stimulation;Moist Heat;Functional mobility Dentist;Therapeutic activities;Therapeutic exercise;Balance training;Neuromuscular re-education;Manual techniques;Patient/family education;Passive range of motion    PT Next Visit Plan  Progress core strengthening.      PT Home Exercise Plan  see handouts    Consulted and Agree with Plan of Care  Patient       Patient will benefit from skilled therapeutic intervention in order to improve the following deficits and impairments:  Improper body mechanics, Pain, Decreased mobility, Postural dysfunction, Decreased strength, Decreased range of motion, Decreased endurance, Decreased activity tolerance, Decreased balance, Difficulty  walking  Visit Diagnosis: S/P kyphoplasty  Muscle weakness (generalized)  Abnormal posture     Problem List There are no active problems to display for this patient.   Olga Coaster PT, DPT 6:37 PM,02/14/18 [phone]  [facility] [address] [place], [place], [address] Phone: [phone]   Fax:  [phone]  Name: KEVONNA OCEGUEDA MRN: [id number] Date  of Birth: 1945/06/16

## 2018-02-21 ENCOUNTER — Ambulatory Visit: Payer: Medicare Other

## 2018-02-21 DIAGNOSIS — Z9889 Other specified postprocedural states: Secondary | ICD-10-CM | POA: Diagnosis not present

## 2018-02-21 DIAGNOSIS — M6281 Muscle weakness (generalized): Secondary | ICD-10-CM

## 2018-02-21 DIAGNOSIS — R293 Abnormal posture: Secondary | ICD-10-CM

## 2018-02-21 NOTE — Therapy (Signed)
[facility] [address] [place], [place], [address] Phone: [phone]   Fax:  [phone]  Physical Therapy Treatment, Progress note, and recertification  Physical Therapy Progress Note   Dates of reporting period  01/23/2018  to   02/20/2018   Patient Details  Name: Haley Porter MRN: [id number] Date of Birth: [birth year] Referring Provider (PT): Dr. Rafael Bihari   Encounter Date: 02/21/2018  PT End of Session - 02/22/18 0746    Visit Number  5    Number of Visits  9    Date for PT Re-Evaluation  03/21/18    Authorization - Visit Number  5    Authorization - Number of Visits  10    PT Start Time  1440    PT Stop Time  1530    PT Time Calculation (min)  50 min    Equipment Utilized During Treatment  Gait belt    Activity Tolerance  Patient tolerated treatment well    Behavior During Therapy  [facility] for tasks assessed/performed       Past Medical History:  Diagnosis Date  . Anxiety   . Arthritis    knees, right shoulder, right hip  . Cat bite of multiple sites    hands, arms and legs.  on antibiotic  . Depression   . Hypercholesteremia   . Hypertension   . Seasonal allergies   . Wears dentures    partial lower    Past Surgical History:  Procedure Laterality Date  . CATARACT EXTRACTION W/PHACO Right 02/25/2015   Procedure: CATARACT EXTRACTION PHACO AND INTRAOCULAR LENS PLACEMENT (IOC);  Surgeon: Leandrew Koyanagi, MD;  Location: [place];  Service: Ophthalmology;  Laterality: Right;  . JOINT REPLACEMENT Left    wrist    There were no vitals filed for this visit.  Subjective Assessment - 02/22/18 0742    Subjective  Pt. entered clinic with 1/10 on NPS today.  Pt. reported one of her exercieses has given her some pain but she stopped performing it and has gotten relief.     Pertinent History  pt. lives alone at home.  Pt. reports increase low back pain when increasing lordosis in seated posture.  Pt.  benefits from raising B arms overhead to decrease low back pain.        Limitations  Standing;Lifting;Walking;House hold activities    Diagnostic tests  see imaging    Patient Stated Goals  Increase core muscle strength/ improve independence with walking/ pain mgmt.      Currently in Pain?  Yes    Pain Score  1     Pain Location  Back    Pain Orientation  Mid    Pain Descriptors / Indicators  Aching    Pain Type  Chronic pain         Therex:    Supine lumbar rotation x10 (pt. Reported comfortable stretch)  Farmers carry x4 25 ft laps with 7lbs  Farmers carry x4 25 ft laps with 8lbs (PT provided mod verbal cueing to maintain upright posture) Bilat nautilus single arm shoulder punch with staggered stance 5# 2x12 (Min verbal and tactile cueing for pt. to achieve proper stance and motion) Bilat Nautilus scapula retractions with 2x15 with 30 lbs (Min tactile cueing for pt. to obtain quality scapular retractions) Resisted ER along wall with shoulder flexion at end range with YTB 2x12 (PT provided mod verbal and visual cues for pt. to keep cervical spine in neutral position)  Sit to stand with overhead 1# med ball press 2x10 (mod verbal cueing for pt. to achieve proper fluid movement with exercise)      Reassessment:    MMT R/L:  UE; Shoulder abduction: 5/5 Shoulder flexion: 5/5 Shoulder IR: 5/5 Shoulder ER: 4+/5 Elbow flexion: 5/5 Elbow extension: 5/5   LE; Hip flexion: 4+/4+ Knee extension: 5/5 Hip abduction: 5/5 Hip adduction: 5/5 DF: 5/5   ROM: (in standing)   Lumbar  Flexion: WFL, pain at end range Extension: WFL SB: WFL bilaterally Rotation: WFL bilaterally          PT Long Term Goals - 02/22/18 0748      PT LONG TERM GOAL #1   Title  Pt. Will score 57 on FOTO to improve pain free movement with daily activities      Baseline  Pt. scored 45 on FOTO (01/23/2018). New score of 54/57 on (02/21/18)    Time  4    Period  Weeks    Status  Partially Met     Target Date  03/21/18      PT LONG TERM GOAL #2   Title  Pt. Will be able to turn to her left and right during backing up in her car with 0/10 on NPS.     Baseline  Pt. currently has 3/10 on NPS pain when turning to back up while driving her car. Pt. has 1/10 on NPS with turning (02/21/18)    Time  4    Period  Weeks    Status  Partially Met    Target Date  03/21/18      PT LONG TERM GOAL #3   Title  Pt. Will be able to confidently perform personalized HEP home maintenance program independently and with 0/10 on NPS.     Baseline  Pt. was given HEP starting (01/23/2018) with exercies that will be progressed to an independant maintenance program.     Time  4    Period  Weeks    Status  Partially Met    Target Date  03/21/18      PT LONG TERM GOAL #4   Title  Pt. Will be able to demonstrate B UE muscle strength to grossly 5/5 MMT to improve upright posture/ ADLs.      Baseline  Pt. currently has  4-/5 right and 4/5 left shoulder abduction. Shoulder flexion: 4/5 right, 4+/5 left as of 01/23/2018    Time  4    Period  Weeks    Status  Achieved    Target Date  02/21/18      PT LONG TERM GOAL #5   Title  Pt. will be able to complete lifting groceries of up to 10 lbs with quality body mechanics and 0/10 pain.    Baseline  Pt. currently does not lift heavy geroceries secondary to fear of pain and re-injury.    Time  4    Period  Weeks    Status  New    Target Date  03/21/18            Plan - 02/22/18 0747    Clinical Impression Statement  Pt. entered clinic today with 1/10 on NPS today. Pt. reported still getting 1/10 on NPS with turning to look over her shoulder with driving. She also noted she has difficulty and sometimes pain with reaching down to pick up items and cannot carry heavy groceries. Pt. demonstrated excellent gross UE strength with MMT reassessment. PT noted moderate mid/low  back guarding along paraspinals. Pt. educated to continue using heat to help ease pain and  tightness in mid back musculature. Pt. showed ability to improve thoracic kyphosis with min verbal and tactile cueing during scapular retractions and therex. Pt. completed therex with fantastic motivation but was limited some by muscle fatigue and needed occasional rest breaks. Pt. will continue to benefit from skilled PT intervention to achieve pain free movement when turning to back up in car and to carry groceries and pick up items in the house with better endurance and to decrease risk of re-injury.     Clinical Presentation  Stable    Clinical Decision Making  Low    Rehab Potential  Fair    PT Frequency  2x / week    PT Duration  4 weeks    PT Treatment/Interventions  ADLs/Self Care Home Management;Cryotherapy;Electrical Stimulation;Moist Heat;Functional mobility Arts administrator;Therapeutic activities;Therapeutic exercise;Balance training;Neuromuscular re-education;Manual techniques;Patient/family education;Passive range of motion    PT Next Visit Plan  Progress core strengthening.      PT Home Exercise Plan  see handouts    Consulted and Agree with Plan of Care  Patient       Patient will benefit from skilled therapeutic intervention in order to improve the following deficits and impairments:  Improper body mechanics, Pain, Decreased mobility, Postural dysfunction, Decreased strength, Decreased range of motion, Decreased endurance, Decreased activity tolerance, Decreased balance, Difficulty walking  Visit Diagnosis: S/P kyphoplasty  Muscle weakness (generalized)  Abnormal posture     Problem List There are no active problems to display for this patient.   Lieutenant Diego PT, DPT 11:27 AM,02/23/18 [phone]  This entire session was performed under direct supervision and direction of a licensed therapist/therapist assistant . I have personally read, edited and approve of the note as written.  [facility] [address] [place], [place], [address] Phone: [phone]   Fax:  [phone]  Name: KRISTYNA BRADSTREET MRN: [id number] Date of Birth: 11/01/1945

## 2018-02-28 ENCOUNTER — Encounter: Payer: Self-pay | Admitting: Physical Therapy

## 2018-02-28 ENCOUNTER — Ambulatory Visit: Payer: Medicare Other | Admitting: Physical Therapy

## 2018-02-28 DIAGNOSIS — Z9889 Other specified postprocedural states: Secondary | ICD-10-CM | POA: Diagnosis not present

## 2018-02-28 DIAGNOSIS — M6281 Muscle weakness (generalized): Secondary | ICD-10-CM

## 2018-02-28 DIAGNOSIS — R293 Abnormal posture: Secondary | ICD-10-CM

## 2018-02-28 NOTE — Patient Instructions (Signed)
Access Code: [email or web address]  URL: [email or web address]  Date: 02/28/2018  Prepared by: Dorene Grebe   Exercises  Single Arm Low Trap Setting at Wall - 15 reps - 3 sets - 1x daily - 7x weekly  Shoulder External Rotation with Resistance - 15 reps - 3 sets - 1x daily - 7x weekly  Seated Upper Trapezius Stretch - 3 sets - 30 hold - 1x daily - 7x weekly

## 2018-02-28 NOTE — Therapy (Addendum)
[facility] [address] [place], [place], [address] Phone: [phone]   Fax:  [phone]  Physical Therapy Treatment  Patient Details  Name: Haley Porter MRN: [id number] Date of Birth: Jan 20, 1945 Referring Provider (PT): Dr. Rafael Bihari   Encounter Date: 02/28/2018  PT End of Session - 04/10/18 1110    Visit Number  6    Number of Visits  9    Date for PT Re-Evaluation  03/21/18    PT Start Time  1111    PT Stop Time  1201    PT Time Calculation (min)  50 min    Activity Tolerance  Patient tolerated treatment well    Behavior During Therapy  [facility] for tasks assessed/performed       Past Medical History:  Diagnosis Date  . Anxiety   . Arthritis    knees, right shoulder, right hip  . Cat bite of multiple sites    hands, arms and legs.  on antibiotic  . Depression   . Hypercholesteremia   . Hypertension   . Seasonal allergies   . Wears dentures    partial lower    Past Surgical History:  Procedure Laterality Date  . CATARACT EXTRACTION W/PHACO Right 02/25/2015   Procedure: CATARACT EXTRACTION PHACO AND INTRAOCULAR LENS PLACEMENT (IOC);  Surgeon: Leandrew Koyanagi, MD;  Location: [place];  Service: Ophthalmology;  Laterality: Right;  . JOINT REPLACEMENT Left    wrist    There were no vitals filed for this visit.  Subjective Assessment - 04/10/18 1108    Subjective  Pt. states she is doing well.  No new complaints.  Pt. understands HEP.      Pertinent History  pt. lives alone at home.  Pt. reports increase low back pain when increasing lordosis in seated posture.  Pt. benefits from raising B arms overhead to decrease low back pain.        Limitations  Standing;Lifting;Walking;House hold activities    Diagnostic tests  see imaging    Patient Stated Goals  Increase core muscle strength/ improve independence with walking/ pain mgmt.      Currently in Pain?  No/denies           Therex:  Supine/ seated lumbar rotation x10 (pt. Reported comfortable stretch)  Reviewed HEP (see handouts) Resisted ER along wall with shoulder flexion at end range with YTB 2x12 (PT provided mod verbal and visual cues for pt. to keep cervical spine in neutral position) Sit to stand with overhead 1# med ball press 2x10 (mod verbal cueing for pt. to achieve proper fluid movement with exercise)  Updated goals.       PT Long Term Goals - 04/10/18 1116      PT LONG TERM GOAL #1   Title  Pt. Will score 57 on FOTO to improve pain free movement with daily activities      Baseline  Pt. scored 45 on FOTO (01/23/2018). New score of 54/57 on (02/21/18)    Time  4    Period  Weeks    Status  Partially Met    Target Date  02/28/18      PT LONG TERM GOAL #2   Title  Pt. Will be able to turn to her left and right during backing up in her car with 0/10 on NPS.     Baseline  Pt. currently has 3/10 on NPS pain when turning to back up while driving her car. Pt. has  1/10 on NPS with turning (02/21/18)    Time  4    Period  Weeks    Status  Partially Met    Target Date  02/28/18      PT LONG TERM GOAL #3   Title  Pt. Will be able to confidently perform personalized HEP home maintenance program independently and with 0/10 on NPS.     Baseline  Good technique    Time  4    Period  Weeks    Status  Achieved    Target Date  02/28/18      PT LONG TERM GOAL #4   Title  Pt. Will be able to demonstrate B UE muscle strength to grossly 5/5 MMT to improve upright posture/ ADLs.      Baseline  Pt. currently has  4-/5 right and 4/5 left shoulder abduction. Shoulder flexion: 4/5 right, 4+/5 left as of 01/23/2018    Time  4    Period  Weeks    Status  Achieved    Target Date  02/21/18      PT LONG TERM GOAL #5   Title  Pt. will be able to complete lifting groceries of up to 10 lbs with quality body mechanics and 0/10 pain.    Baseline  No complaints.     Time  4    Period  Weeks    Status   Achieved    Target Date  02/28/18            Plan - 04/10/18 1111    Clinical Impression Statement  Pt. has progressed well with home based exercise program and will continue with an independent program at this time.  See updated goals.  Pt. issued more progressive HEP and instructed to contact PT if any issues or questions in future.  Discharge from PT at this time.      Stability/Clinical Decision Making  Stable/Uncomplicated    Clinical Decision Making  Low    Rehab Potential  Fair    PT Frequency  2x / week    PT Duration  4 weeks    PT Treatment/Interventions  ADLs/Self Care Home Management;Cryotherapy;Electrical Stimulation;Moist Heat;Functional mobility Arts administrator;Therapeutic activities;Therapeutic exercise;Balance training;Neuromuscular re-education;Manual techniques;Patient/family education;Passive range of motion    PT Next Visit Plan  Discharge visit    PT Home Exercise Plan  see handouts       Patient will benefit from skilled therapeutic intervention in order to improve the following deficits and impairments:  Improper body mechanics, Pain, Decreased mobility, Postural dysfunction, Decreased strength, Decreased range of motion, Decreased endurance, Decreased activity tolerance, Decreased balance, Difficulty walking  Visit Diagnosis: S/P kyphoplasty  Muscle weakness (generalized)  Abnormal posture     Problem List There are no active problems to display for this patient.  Pura Spice, PT, DPT # [id number] Florentina Jenny, SPT 04/10/2018, 11:19 AM  [facility] [address] [place], [place], [address] Phone: [phone]   Fax:  [phone]  Name: Haley Porter MRN: [id number] Date of Birth: [birth year]

## 2018-03-07 ENCOUNTER — Encounter: Payer: Medicare Other | Admitting: Physical Therapy

## 2018-12-20 ENCOUNTER — Encounter: Payer: Self-pay | Admitting: Intensive Care

## 2018-12-20 ENCOUNTER — Emergency Department: Payer: Medicare Other

## 2018-12-20 ENCOUNTER — Emergency Department
Admission: EM | Admit: 2018-12-20 | Discharge: 2018-12-20 | Disposition: A | Discharge status: Home / Self Care | Payer: Medicare Other | Attending: Emergency Medicine | Admitting: Emergency Medicine

## 2018-12-20 ENCOUNTER — Other Ambulatory Visit: Payer: Self-pay

## 2018-12-20 DIAGNOSIS — R05 Cough: Secondary | ICD-10-CM | POA: Diagnosis present

## 2018-12-20 DIAGNOSIS — J069 Acute upper respiratory infection, unspecified: Secondary | ICD-10-CM

## 2018-12-20 DIAGNOSIS — I10 Essential (primary) hypertension: Secondary | ICD-10-CM | POA: Insufficient documentation

## 2018-12-20 DIAGNOSIS — Z20828 Contact with and (suspected) exposure to other viral communicable diseases: Secondary | ICD-10-CM | POA: Insufficient documentation

## 2018-12-20 DIAGNOSIS — B349 Viral infection, unspecified: Secondary | ICD-10-CM | POA: Insufficient documentation

## 2018-12-20 DIAGNOSIS — Z79899 Other long term (current) drug therapy: Secondary | ICD-10-CM | POA: Insufficient documentation

## 2018-12-20 DIAGNOSIS — J029 Acute pharyngitis, unspecified: Secondary | ICD-10-CM

## 2018-12-20 DIAGNOSIS — N390 Urinary tract infection, site not specified: Secondary | ICD-10-CM

## 2018-12-20 LAB — URINALYSIS, COMPLETE (UACMP) WITH MICROSCOPIC
Bacteria, UA: NONE SEEN
Bilirubin Urine: NEGATIVE
Glucose, UA: NEGATIVE mg/dL
Ketones, ur: NEGATIVE mg/dL
Nitrite: NEGATIVE
Protein, ur: NEGATIVE mg/dL
Specific Gravity, Urine: 1.018 (ref 1.005–1.030)
pH: 6 (ref 5.0–8.0)

## 2018-12-20 LAB — GROUP A STREP BY PCR: Group A Strep by PCR: NOT DETECTED

## 2018-12-20 MED ORDER — AMOXICILLIN 500 MG PO CAPS: 500.0000 mg | ORAL_CAPSULE | Freq: Three times a day (TID) | ORAL | 0 refills | Status: AC

## 2018-12-20 MED ORDER — BENZONATATE 100 MG PO CAPS
100.0000 mg | ORAL_CAPSULE | Freq: Once | ORAL | Status: AC
  Administered 2018-12-20: 100 mg via ORAL
  Filled 2018-12-20: qty 1

## 2018-12-20 MED ORDER — BENZONATATE 100 MG PO CAPS: 100.0000 mg | ORAL_CAPSULE | Freq: Three times a day (TID) | ORAL | 0 refills | Status: AC | PRN

## 2018-12-20 MED ORDER — AMOXICILLIN 500 MG PO CAPS
500.0000 mg | ORAL_CAPSULE | Freq: Once | ORAL | Status: AC
  Administered 2018-12-20: 500 mg via ORAL
  Filled 2018-12-20: qty 1

## 2018-12-20 NOTE — ED Notes (Signed)
PT denies CP/SOB 

## 2018-12-20 NOTE — Discharge Instructions (Signed)
Follow discharge care instruction take medication as directed.  Advised self quarantine pending results of COVID-19 test. 

## 2018-12-20 NOTE — ED Triage Notes (Signed)
Patient reports she is here for a COVID test due to cough and sore throat X3 days. Reports foul smelling urine also. Reports she feels like she has a "really bad cold"

## 2018-12-20 NOTE — ED Triage Notes (Signed)
Pt in via EMS from home. Pt c/o sore throat for 2 days so she called a triage line and was advised to go to the nearest emergency room asap. 184/76, hx of HTN. EMS reports pt also reports fould odor coming from urine and a little confusion in the ED. 98% RA, 98.7t oral

## 2018-12-20 NOTE — ED Provider Notes (Signed)
[facility] Emergency Department Provider Note   ____________________________________________   First MD Initiated Contact with Patient 12/20/18 1638     (approximate)  I have reviewed the triage vital signs and the nursing notes.   HISTORY  Chief Complaint Cough and Sore Throat    HPI Haley Porter is a 73 y.o. female patient advised to come to ED by her PCP secondary to having sore throat and a cough.  She was advised to have a COVID-19 test.  Patient also reports foul-smelling urine.  Denies urinary frequency, urgency or dysuria.  Denies vaginal discharge.  Patient unsure of fever.  Denies recent travel or known contact with COVID-19.  Patient rates her pain as a 5/10.  Patient described pain is "achy".  No palliative measures for complaint.         Past Medical History:  Diagnosis Date  . Anxiety   . Arthritis    knees, right shoulder, right hip  . Cat bite of multiple sites    hands, arms and legs.  on antibiotic  . Depression   . Hypercholesteremia   . Hypertension   . Seasonal allergies   . Wears dentures    partial lower    There are no problems to display for this patient.   Past Surgical History:  Procedure Laterality Date  . CATARACT EXTRACTION W/PHACO Right 02/25/2015   Procedure: CATARACT EXTRACTION PHACO AND INTRAOCULAR LENS PLACEMENT (IOC);  Surgeon: Lockie Mola, MD;  [facility];  Service: Ophthalmology;  Laterality: Right;  . JOINT REPLACEMENT Left    wrist    Prior to Admission medications   Medication Sig Start Date End Date Taking? Authorizing Provider  amoxicillin (AMOXIL) 500 MG capsule Take 1 capsule (500 mg total) by mouth 3 (three) times daily. 05/20/15   Jolene Provost, MD  amoxicillin (AMOXIL) 500 MG capsule Take 1 capsule (500 mg total) by mouth 3 (three) times daily. 12/20/18   Joni Reining, PA-C  amoxicillin-clavulanate (AUGMENTIN) 875-125 MG tablet Take 1 tablet by mouth 2  (two) times daily.    [provider]  aspirin 325 MG tablet Take 325 mg by mouth daily.    [provider]  atorvastatin (LIPITOR) 40 MG tablet Take 40 mg by mouth daily.    [provider]  benzonatate (TESSALON PERLES) 100 MG capsule Take 1 capsule (100 mg total) by mouth 3 (three) times daily as needed. 12/20/18 12/20/19  Joni Reining, PA-C  fluticasone [facility]) 50 MCG/ACT nasal spray Place into both nostrils daily as needed for allergies or rhinitis.    [provider]  hydrochlorothiazide (HYDRODIURIL) 25 MG tablet Take 25 mg by mouth daily. 0.5 to 1 tab daily    [provider]  lamoTRIgine (LAMICTAL) 25 MG tablet Take 25 mg by mouth 2 (two) times daily. 2.5 tabs twice daily    [provider]  LORazepam (ATIVAN) 0.5 MG tablet Take 0.5 mg by mouth as needed for anxiety.    [provider]  losartan (COZAAR) 100 MG tablet Take 100 mg by mouth daily.    [provider]  POTASSIUM PO Take 10 mcg by mouth 2 (two) times daily.    [provider]  vitamin C (ASCORBIC ACID) 500 MG tablet Take 500 mg by mouth daily.    [provider]    Allergies Ace inhibitors and Prednisone  History reviewed. No pertinent family history.  Social History Social History   Tobacco Use  .  Smoking status: Never Smoker  . Smokeless tobacco: Never Used  Substance Use Topics  . Alcohol use: No  . Drug use: Never    Review of Systems Constitutional: No fever/chills Eyes: No visual changes. ENT: No sore throat. Cardiovascular: Denies chest pain. Respiratory: Denies shortness of breath. Gastrointestinal: No abdominal pain.  No nausea, no vomiting.  No diarrhea.  No constipation. Genitourinary: Negative for dysuria. Musculoskeletal: Negative for back pain. Skin: Negative for rash. Neurological: Negative for headaches, focal weakness or numbness. Psychiatric:  Anxiety and depression. Endocrine:  Hyperlipidemia  and hypertension. Allergic/Immunilogical: ACE inhibitor's and prednisone.  ____________________________________________   PHYSICAL EXAM:  VITAL SIGNS: ED Triage Vitals  Enc Vitals Group     BP 12/20/18 1509 (!) 161/62     Pulse Rate 12/20/18 1509 73     Resp 12/20/18 1509 18     Temp 12/20/18 1509 98.8 F (37.1 C)     Temp Source 12/20/18 1509 Oral     SpO2 12/20/18 1509 96 %     Weight 12/20/18 1511 175 lb (79.4 kg)     Height 12/20/18 1511 5\' 4"  (1.626 m)     Head Circumference --      Peak Flow --      Pain Score 12/20/18 1511 5     Pain Loc --      Pain Edu? --      Excl. in GC? --     Constitutional: Alert and oriented. Well appearing and in no acute distress.  Afebrile. Nose: No congestion/rhinnorhea. Mouth/Throat: Mucous membranes are moist.  Oropharynx erythematous. Neck: No stridor.  No cervical spine tenderness to palpation. Hematological/Lymphatic/Immunilogical: No cervical lymphadenopathy. Cardiovascular: Normal rate, regular rhythm. Grossly normal heart sounds.  Good peripheral circulation.  Elevated blood pressure. Respiratory: Normal respiratory effort.  No retractions. Lungs CTAB. Gastrointestinal: Soft and nontender. No distention. No abdominal bruits. No CVA tenderness. Genitourinary: Deferred Musculoskeletal: No lower extremity tenderness nor edema.  No joint effusions. Neurologic:  Normal speech and language. No gross focal neurologic deficits are appreciated. No gait instability. Skin:  Skin is warm, dry and intact. No rash noted. Psychiatric: Mood and affect are normal. Speech and behavior are normal.  ____________________________________________   LABS (all labs ordered are listed, but only abnormal results are displayed)  Labs Reviewed  URINALYSIS, COMPLETE (UACMP) WITH MICROSCOPIC - Abnormal; Notable for the following components:      Result Value   Color, Urine YELLOW (*)    APPearance HAZY (*)    Hgb urine dipstick MODERATE (*)     Leukocytes,Ua MODERATE (*)    All other components within normal limits  GROUP A STREP BY PCR  SARS CORONAVIRUS 2 (TAT 6-24 HRS)  URINE CULTURE   ____________________________________________  EKG   ____________________________________________  RADIOLOGY  ED MD interpretation:    Official radiology report(s): DG Chest Portable 1 View  Result Date: 12/20/2018 CLINICAL DATA:  Cough and fatigue EXAM: PORTABLE CHEST 1 VIEW COMPARISON:  None. FINDINGS: The heart size and mediastinal contours are within normal limits. Both lungs are clear. No pleural effusion. The visualized skeletal structures are unremarkable. IMPRESSION: No acute process in the chest. Electronically Signed   By: Guadlupe SpanishPraneil  Patel M.D.   On: 12/20/2018 17:00    ____________________________________________   PROCEDURES  Procedure(s) performed (including Critical Care):  Procedures   ____________________________________________   INITIAL IMPRESSION / ASSESSMENT AND PLAN / ED COURSE  As part of my medical decision making, I reviewed the following data within the electronic medical  record:      Patient presents with sore throat and cough for 2 days.  On advice of PCP she come in for evaluation for COVID-19.  Patient also states she has foul-smelling urine.  Discussed negative strep test with patient.  Patient urine consistent with urinary tract infection.  Patient advised to Covid test results are pending.  Patient given discharge care instruction advised take medication as directed.  Patient advised self quarantine pending results of COVID-19 test.  Return to ED if condition worsens.    Haley Porter was evaluated in Emergency Department on 12/20/2018 for the symptoms described in the history of present illness. She was evaluated in the context of the global COVID-19 pandemic, which necessitated consideration that the patient might be at risk for infection with the SARS-CoV-2 virus that causes COVID-19.  Institutional protocols and algorithms that pertain to the evaluation of patients at risk for COVID-19 are in a state of rapid change based on information released by regulatory bodies including the CDC and federal and state organizations. These policies and algorithms were followed during the patient's care in the ED.       ____________________________________________   FINAL CLINICAL IMPRESSION(S) / ED DIAGNOSES  Final diagnoses:  Viral URI with cough  Sore throat  Urinary tract infection without hematuria, site unspecified     ED Discharge Orders         Ordered    amoxicillin (AMOXIL) 500 MG capsule  3 times daily     12/20/18 1751    benzonatate (TESSALON PERLES) 100 MG capsule  3 times daily PRN     12/20/18 1751           Note:  This document was prepared using Dragon voice recognition software and may include unintentional dictation errors.    Sable Feil, PA-C 12/20/18 1755    Blake Divine, MD 12/22/18 [phone]

## 2018-12-21 LAB — URINE CULTURE: Special Requests: NORMAL

## 2018-12-21 LAB — SARS CORONAVIRUS 2 (TAT 6-24 HRS): SARS Coronavirus 2: NEGATIVE

## 2019-06-04 ENCOUNTER — Other Ambulatory Visit: Payer: Self-pay | Admitting: Family Medicine

## 2019-06-04 DIAGNOSIS — Z1231 Encounter for screening mammogram for malignant neoplasm of breast: Secondary | ICD-10-CM

## 2019-06-12 ENCOUNTER — Other Ambulatory Visit: Payer: Self-pay

## 2019-06-12 ENCOUNTER — Ambulatory Visit
Admission: RE | Admit: 2019-06-12 | Discharge: 2019-06-12 | Disposition: A | Discharge status: Home / Self Care | Payer: Medicare Other | Source: Ambulatory Visit | Attending: Family Medicine | Admitting: Family Medicine

## 2019-06-12 ENCOUNTER — Encounter (INDEPENDENT_AMBULATORY_CARE_PROVIDER_SITE_OTHER): Payer: Self-pay

## 2019-06-12 DIAGNOSIS — Z1231 Encounter for screening mammogram for malignant neoplasm of breast: Secondary | ICD-10-CM | POA: Insufficient documentation

## 2019-06-19 ENCOUNTER — Inpatient Hospital Stay
Admission: RE | Admit: 2019-06-19 | Discharge: 2019-06-19 | Disposition: A | Discharge status: Home / Self Care | Payer: Self-pay | Source: Ambulatory Visit | Attending: *Deleted | Admitting: *Deleted

## 2019-06-19 ENCOUNTER — Other Ambulatory Visit: Payer: Self-pay | Admitting: *Deleted

## 2019-06-19 DIAGNOSIS — Z1231 Encounter for screening mammogram for malignant neoplasm of breast: Secondary | ICD-10-CM

## 2020-11-30 ENCOUNTER — Other Ambulatory Visit: Payer: Self-pay | Admitting: Family Medicine

## 2020-11-30 DIAGNOSIS — Z1231 Encounter for screening mammogram for malignant neoplasm of breast: Secondary | ICD-10-CM

## 2020-12-17 ENCOUNTER — Ambulatory Visit
Admission: RE | Admit: 2020-12-17 | Discharge: 2020-12-17 | Disposition: A | Discharge status: Home / Self Care | Payer: Medicare Other | Source: Ambulatory Visit | Attending: Family Medicine | Admitting: Family Medicine

## 2020-12-17 ENCOUNTER — Other Ambulatory Visit: Payer: Self-pay

## 2020-12-17 DIAGNOSIS — Z1231 Encounter for screening mammogram for malignant neoplasm of breast: Secondary | ICD-10-CM | POA: Insufficient documentation

## 2021-11-08 ENCOUNTER — Other Ambulatory Visit: Payer: Self-pay | Admitting: Family Medicine

## 2021-11-08 DIAGNOSIS — Z1231 Encounter for screening mammogram for malignant neoplasm of breast: Secondary | ICD-10-CM

## 2021-12-20 ENCOUNTER — Ambulatory Visit
Admission: RE | Admit: 2021-12-20 | Discharge: 2021-12-20 | Disposition: A | Discharge status: Home / Self Care | Payer: Medicare Other | Source: Ambulatory Visit | Attending: Family Medicine | Admitting: Family Medicine

## 2021-12-20 DIAGNOSIS — Z1231 Encounter for screening mammogram for malignant neoplasm of breast: Secondary | ICD-10-CM | POA: Diagnosis not present

## 2021-12-28 ENCOUNTER — Other Ambulatory Visit: Payer: Self-pay | Admitting: Family Medicine

## 2021-12-28 DIAGNOSIS — N63 Unspecified lump in unspecified breast: Secondary | ICD-10-CM

## 2021-12-28 DIAGNOSIS — R928 Other abnormal and inconclusive findings on diagnostic imaging of breast: Secondary | ICD-10-CM

## 2021-12-29 ENCOUNTER — Ambulatory Visit
Admission: RE | Admit: 2021-12-29 | Discharge: 2021-12-29 | Disposition: A | Discharge status: Home / Self Care | Payer: Medicare Other | Source: Ambulatory Visit | Attending: Family Medicine | Admitting: Family Medicine

## 2021-12-29 DIAGNOSIS — N63 Unspecified lump in unspecified breast: Secondary | ICD-10-CM

## 2021-12-29 DIAGNOSIS — R928 Other abnormal and inconclusive findings on diagnostic imaging of breast: Secondary | ICD-10-CM | POA: Insufficient documentation

## 2022-07-18 LAB — EXTERNAL GENERIC LAB PROCEDURE: COLOGUARD: NEGATIVE

## 2022-11-09 ENCOUNTER — Other Ambulatory Visit: Payer: Self-pay | Admitting: Family Medicine

## 2022-11-09 DIAGNOSIS — Z780 Asymptomatic menopausal state: Secondary | ICD-10-CM

## 2022-11-09 DIAGNOSIS — M8589 Other specified disorders of bone density and structure, multiple sites: Secondary | ICD-10-CM

## 2022-11-09 DIAGNOSIS — N1831 Chronic kidney disease, stage 3a: Secondary | ICD-10-CM

## 2022-11-21 ENCOUNTER — Other Ambulatory Visit: Payer: Self-pay | Admitting: Family Medicine

## 2022-11-21 DIAGNOSIS — Z1231 Encounter for screening mammogram for malignant neoplasm of breast: Secondary | ICD-10-CM

## 2022-11-30 ENCOUNTER — Ambulatory Visit
Admission: RE | Admit: 2022-11-30 | Discharge: 2022-11-30 | Disposition: A | Discharge status: Home / Self Care | Payer: Medicare Other | Source: Ambulatory Visit | Attending: Family Medicine | Admitting: Family Medicine

## 2022-11-30 DIAGNOSIS — M8589 Other specified disorders of bone density and structure, multiple sites: Secondary | ICD-10-CM | POA: Diagnosis present

## 2022-11-30 DIAGNOSIS — Z780 Asymptomatic menopausal state: Secondary | ICD-10-CM | POA: Insufficient documentation

## 2022-11-30 DIAGNOSIS — N1831 Chronic kidney disease, stage 3a: Secondary | ICD-10-CM | POA: Insufficient documentation

## 2023-01-09 ENCOUNTER — Ambulatory Visit
Admission: RE | Admit: 2023-01-09 | Discharge: 2023-01-09 | Disposition: A | Discharge status: Home / Self Care | Payer: Medicare Other | Source: Ambulatory Visit | Attending: Family Medicine | Admitting: Family Medicine

## 2023-01-09 DIAGNOSIS — Z1231 Encounter for screening mammogram for malignant neoplasm of breast: Secondary | ICD-10-CM | POA: Insufficient documentation

## 2023-03-27 IMAGING — MG MM DIGITAL SCREENING BILAT W/ TOMO AND CAD
8 series · 8 of 24 positions shown · non-contrast
Comparison: Previous exam(s).

CLINICAL DATA: Screening.

EXAM:
DIGITAL SCREENING BILATERAL MAMMOGRAM WITH TOMOSYNTHESIS AND CAD
TECHNIQUE: Bilateral screening digital craniocaudal and mediolateral oblique
mammograms were obtained. Bilateral screening digital breast
tomosynthesis was performed. The images were evaluated with
computer-aided detection.

[R MLO synth-2D]
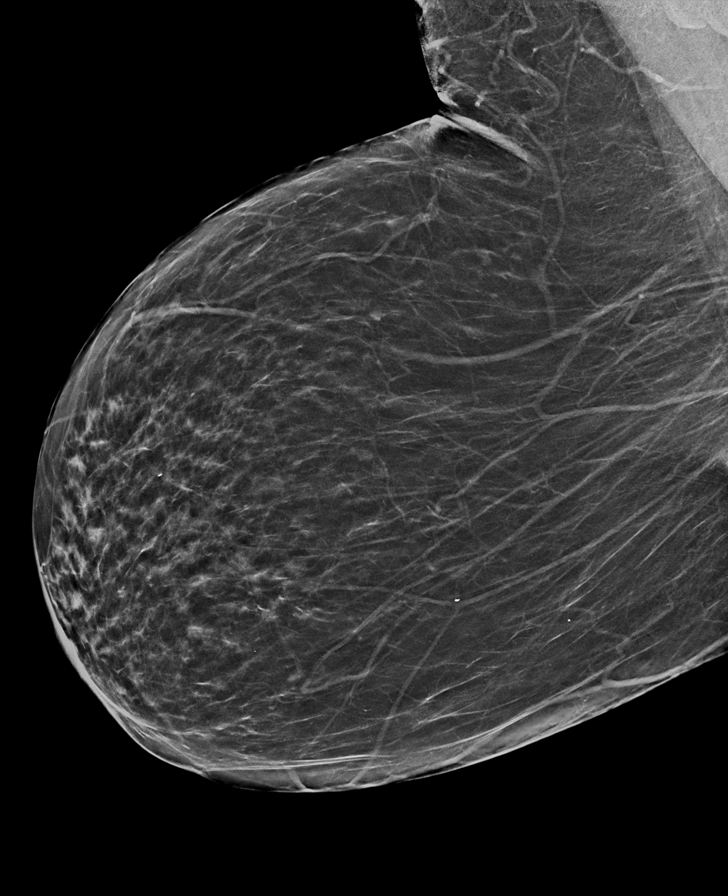

[L CC synth-2D]
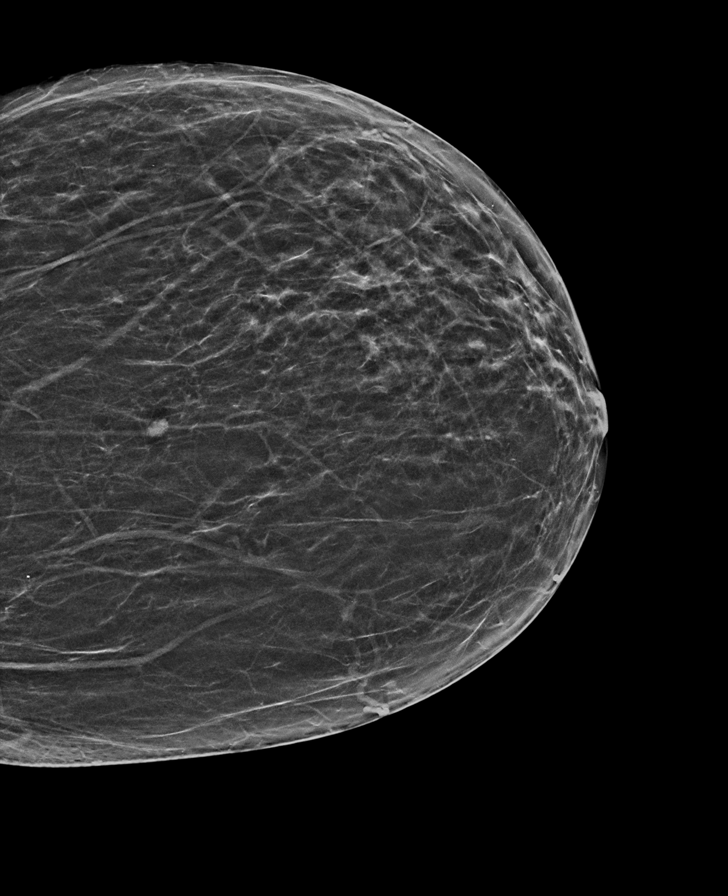

[L MLO synth-2D]
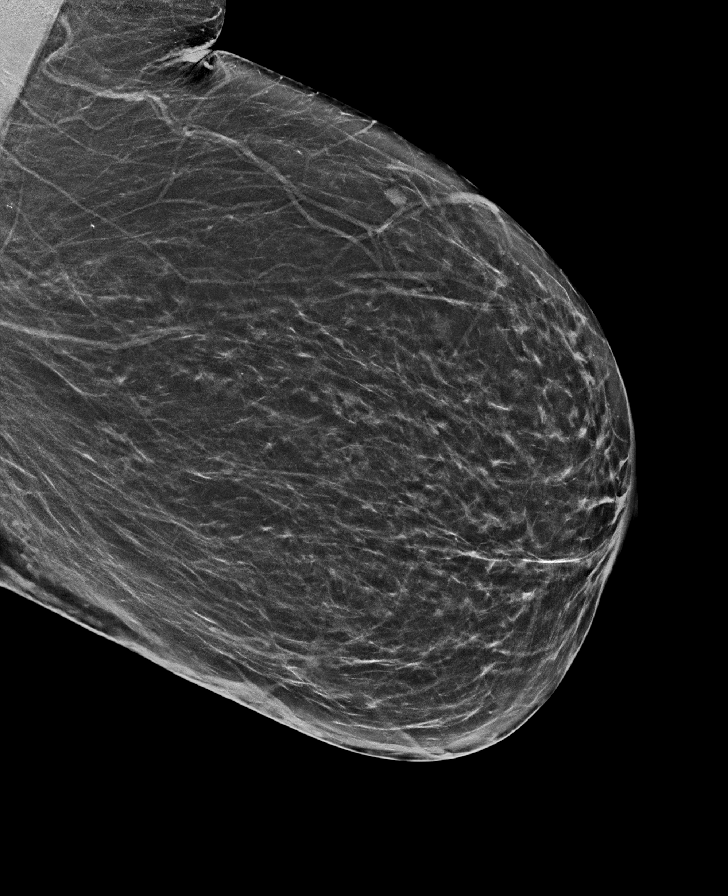

[R CC synth-2D]
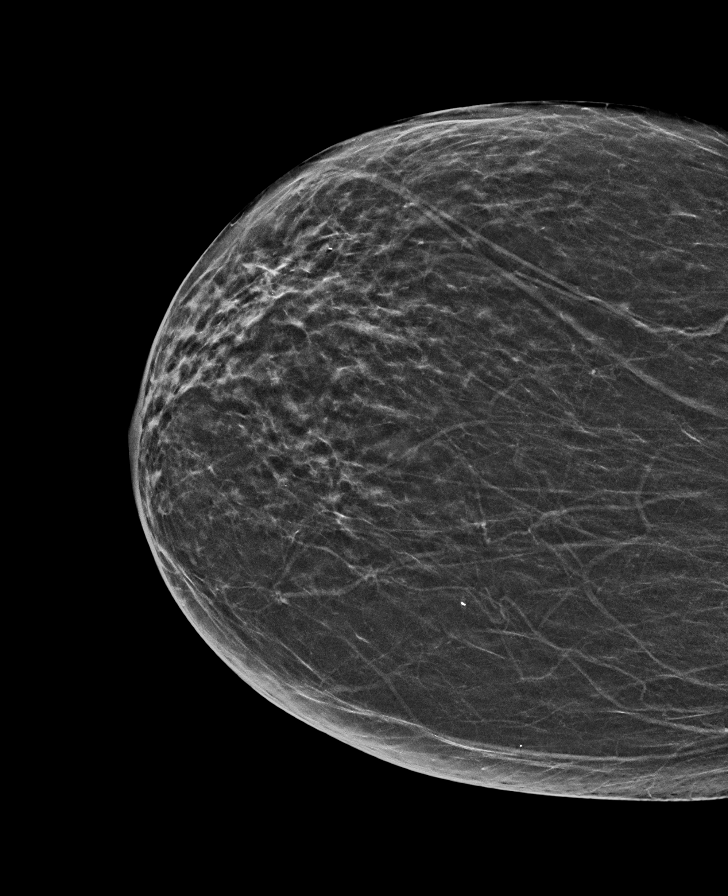

[L MLO tomo · tomo slice 31/62.0]
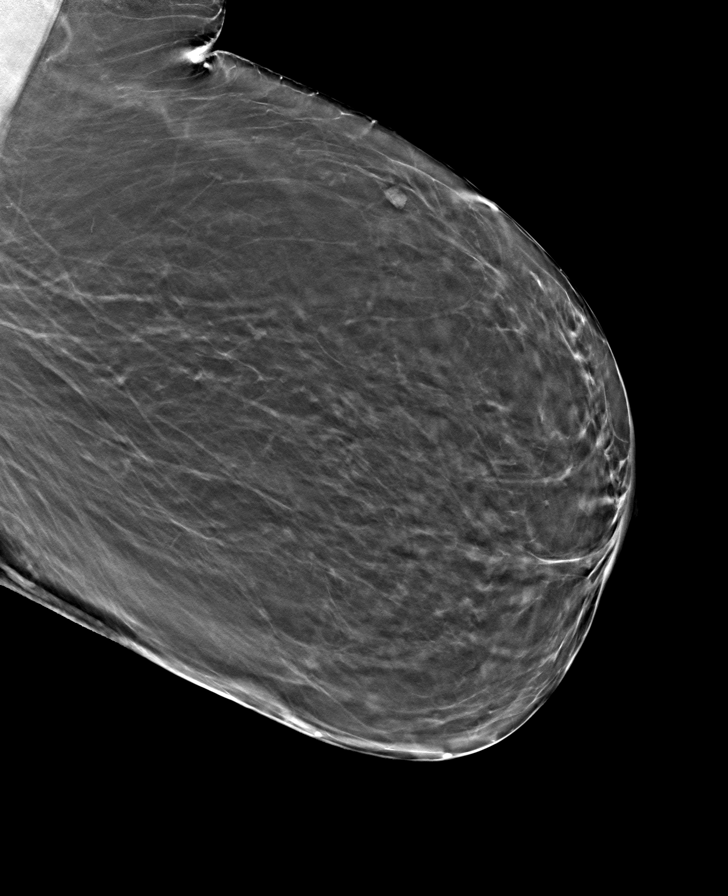

[L CC tomo · tomo slice 27/52.0]
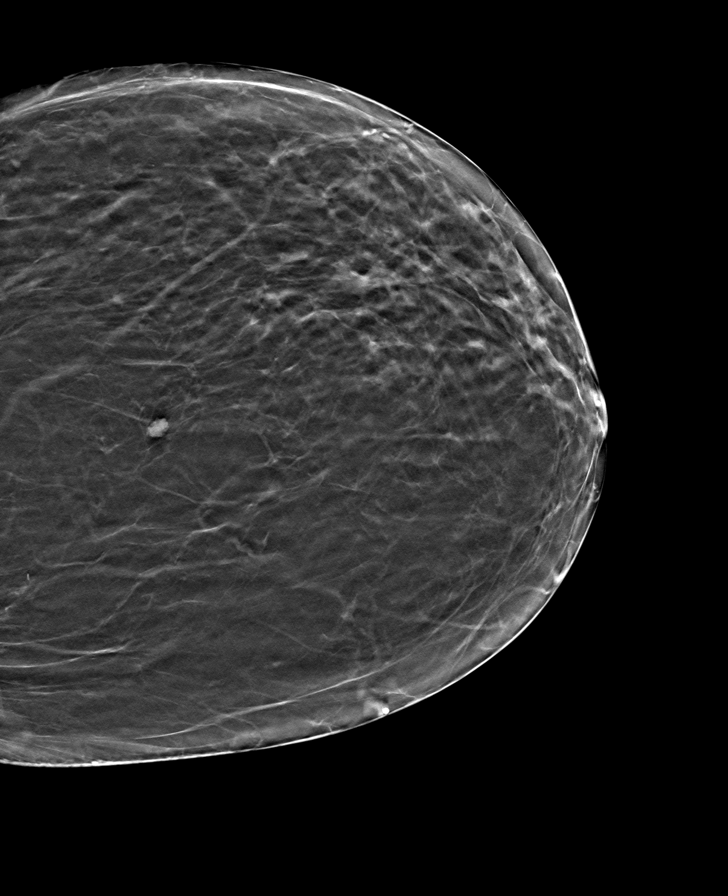

[R CC tomo · tomo slice 27/52.0]
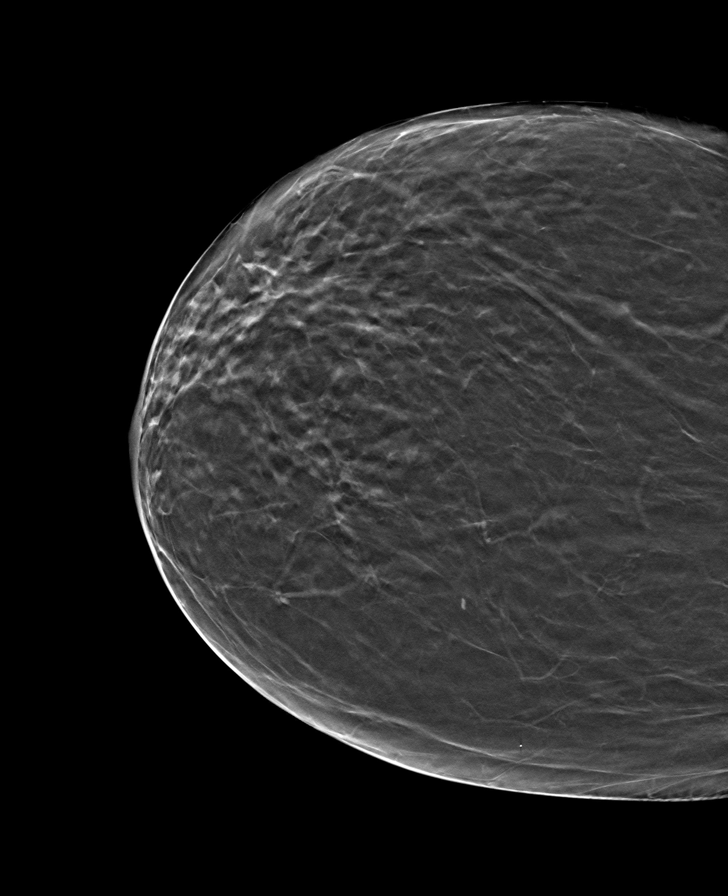

[R MLO tomo · tomo slice 31/62.0]
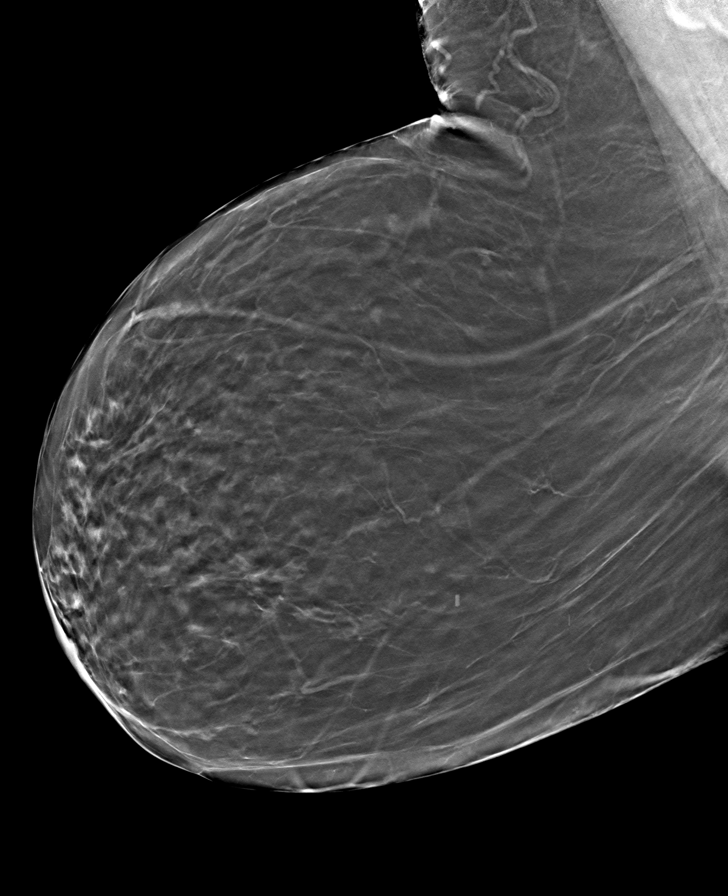

[8 of 24 positions shown; findings below may reference images not displayed]

ACR Breast Density Category b: There are scattered areas of
fibroglandular density.
FINDINGS: There are no findings suspicious for malignancy.
IMPRESSION: No mammographic evidence of malignancy. A result letter of this
screening mammogram will be mailed directly to the patient.

RECOMMENDATION:
Screening mammogram in one year. (Code:51-O-LD2)

BI-RADS CATEGORY  1: Negative.
